# Patient Record
Sex: Female | Born: 1990 | Race: White | Hispanic: No | Marital: Married | Smoking: Never smoker
Health system: Southern US, Community
[De-identification: ages and names within clinical notes are randomized; demographics above are authoritative.]

## PROBLEM LIST (undated history)

## (undated) DIAGNOSIS — G43909 Migraine, unspecified, not intractable, without status migrainosus: Secondary | ICD-10-CM

## (undated) DIAGNOSIS — D649 Anemia, unspecified: Secondary | ICD-10-CM

## (undated) DIAGNOSIS — F32A Depression, unspecified: Secondary | ICD-10-CM

## (undated) DIAGNOSIS — F419 Anxiety disorder, unspecified: Secondary | ICD-10-CM

## (undated) DIAGNOSIS — T7840XA Allergy, unspecified, initial encounter: Secondary | ICD-10-CM

## (undated) DIAGNOSIS — K8020 Calculus of gallbladder without cholecystitis without obstruction: Secondary | ICD-10-CM

## (undated) HISTORY — DX: Depression, unspecified: F32.A

## (undated) HISTORY — DX: Anxiety disorder, unspecified: F41.9

## (undated) HISTORY — DX: Migraine, unspecified, not intractable, without status migrainosus: G43.909

## (undated) HISTORY — PX: NO PAST SURGERIES: SHX2092

## (undated) HISTORY — PX: TUBAL LIGATION: SHX77

## (undated) HISTORY — DX: Allergy, unspecified, initial encounter: T78.40XA

## (undated) HISTORY — DX: Anemia, unspecified: D64.9

---

## 2010-01-09 ENCOUNTER — Inpatient Hospital Stay (HOSPITAL_COMMUNITY)
Admission: AD | Admit: 2010-01-09 | Discharge: 2010-01-11 | Payer: Medicaid Other | Attending: Obstetrics & Gynecology | Admitting: Obstetrics & Gynecology

## 2010-05-15 LAB — CBC
Hemoglobin: 11.2 g/dL — ABNORMAL LOW (ref 12.0–15.0)
MCH: 33.2 pg (ref 26.0–34.0)
MCV: 94.5 fL (ref 78.0–100.0)
Platelets: 179 10*3/uL (ref 150–400)
Platelets: 225 10*3/uL (ref 150–400)
RBC: 3.37 MIL/uL — ABNORMAL LOW (ref 3.87–5.11)
RBC: 3.6 MIL/uL — ABNORMAL LOW (ref 3.87–5.11)
RDW: 12.3 % (ref 11.5–15.5)
WBC: 14.5 10*3/uL — ABNORMAL HIGH (ref 4.0–10.5)
WBC: 15.4 10*3/uL — ABNORMAL HIGH (ref 4.0–10.5)

## 2014-01-10 LAB — OB RESULTS CONSOLE ANTIBODY SCREEN: Antibody Screen: NEGATIVE

## 2014-01-10 LAB — OB RESULTS CONSOLE ABO/RH: RH Type: POSITIVE

## 2014-01-10 LAB — OB RESULTS CONSOLE RUBELLA ANTIBODY, IGM: Rubella: IMMUNE

## 2014-01-10 LAB — OB RESULTS CONSOLE RPR: RPR: NONREACTIVE

## 2014-01-10 LAB — OB RESULTS CONSOLE HEPATITIS B SURFACE ANTIGEN: HEP B S AG: NEGATIVE

## 2014-01-10 LAB — OB RESULTS CONSOLE HIV ANTIBODY (ROUTINE TESTING): HIV: NONREACTIVE

## 2014-03-04 NOTE — L&D Delivery Note (Signed)
Delivery Note At 1:12 PM a viable and healthy female was delivered via Vaginal, Spontaneous Delivery (Presentation: Left Occiput Anterior).  APGAR: 9, 9; weight  .   Placenta status: Intact, Spontaneous.  Cord: 3 vessels with the following complications: None.  Anesthesia: None   Episiotomy:   Lacerations: 1st degree;Perineal Suture Repair: 2.0 chromic Est. Blood Loss (mL): 400  Mom to postpartum.  Baby to Couplet care / Skin to Skin.  Essie HartINN, Dinita Migliaccio STACIA 07/07/2014, 1:51 PM

## 2014-06-09 LAB — OB RESULTS CONSOLE GBS: STREP GROUP B AG: NEGATIVE

## 2014-07-05 ENCOUNTER — Inpatient Hospital Stay (HOSPITAL_COMMUNITY): Payer: Medicaid Other

## 2014-07-05 ENCOUNTER — Encounter (HOSPITAL_COMMUNITY): Payer: Self-pay

## 2014-07-05 ENCOUNTER — Inpatient Hospital Stay (HOSPITAL_COMMUNITY)
Admission: AD | Admit: 2014-07-05 | Discharge: 2014-07-05 | Disposition: A | Discharge status: Home / Self Care | Payer: Medicaid Other | Source: Ambulatory Visit | Attending: Obstetrics & Gynecology | Admitting: Obstetrics & Gynecology

## 2014-07-05 DIAGNOSIS — Z3A39 39 weeks gestation of pregnancy: Secondary | ICD-10-CM | POA: Insufficient documentation

## 2014-07-05 DIAGNOSIS — K8020 Calculus of gallbladder without cholecystitis without obstruction: Secondary | ICD-10-CM

## 2014-07-05 DIAGNOSIS — O9989 Other specified diseases and conditions complicating pregnancy, childbirth and the puerperium: Secondary | ICD-10-CM | POA: Insufficient documentation

## 2014-07-05 DIAGNOSIS — R1010 Upper abdominal pain, unspecified: Secondary | ICD-10-CM | POA: Insufficient documentation

## 2014-07-05 LAB — CBC
HCT: 33.8 % — ABNORMAL LOW (ref 36.0–46.0)
Hemoglobin: 11.7 g/dL — ABNORMAL LOW (ref 12.0–15.0)
MCH: 31.9 pg (ref 26.0–34.0)
MCHC: 34.6 g/dL (ref 30.0–36.0)
MCV: 92.1 fL (ref 78.0–100.0)
PLATELETS: 249 10*3/uL (ref 150–400)
RBC: 3.67 MIL/uL — AB (ref 3.87–5.11)
RDW: 12.9 % (ref 11.5–15.5)
WBC: 16.7 10*3/uL — AB (ref 4.0–10.5)

## 2014-07-05 LAB — COMPREHENSIVE METABOLIC PANEL
ALT: 15 U/L (ref 14–54)
AST: 37 U/L (ref 15–41)
Albumin: 3.3 g/dL — ABNORMAL LOW (ref 3.5–5.0)
Alkaline Phosphatase: 154 U/L — ABNORMAL HIGH (ref 38–126)
Anion gap: 10 (ref 5–15)
BILIRUBIN TOTAL: 0.5 mg/dL (ref 0.3–1.2)
BUN: 8 mg/dL (ref 6–20)
CALCIUM: 8.7 mg/dL — AB (ref 8.9–10.3)
CO2: 22 mmol/L (ref 22–32)
CREATININE: 0.61 mg/dL (ref 0.44–1.00)
Chloride: 104 mmol/L (ref 101–111)
GFR calc Af Amer: >60 mL/min (ref >60)
GLUCOSE: 105 mg/dL — AB (ref 70–99)
Potassium: 3.5 mmol/L (ref 3.5–5.1)
Sodium: 136 mmol/L (ref 135–145)
TOTAL PROTEIN: 7.3 g/dL (ref 6.5–8.1)

## 2014-07-05 LAB — AMYLASE: AMYLASE: 35 U/L (ref 28–100)

## 2014-07-05 LAB — AMNISURE RUPTURE OF MEMBRANE (ROM) NOT AT ARMC: Amnisure ROM: NEGATIVE

## 2014-07-05 LAB — LIPASE, BLOOD: LIPASE: 22 U/L (ref 22–51)

## 2014-07-05 MED ORDER — ONDANSETRON 8 MG PO TBDP
8.0000 mg | ORAL_TABLET | Freq: Once | ORAL | Status: AC
  Administered 2014-07-05: 8 mg via ORAL
  Filled 2014-07-05: qty 1

## 2014-07-05 MED ORDER — GI COCKTAIL ~~LOC~~
30.0000 mL | Freq: Once | ORAL | Status: AC
  Administered 2014-07-05: 30 mL via ORAL
  Filled 2014-07-05: qty 30

## 2014-07-05 MED ORDER — OXYCODONE-ACETAMINOPHEN 5-325 MG PO TABS: 1.0000 | ORAL_TABLET | ORAL | Status: DC | PRN

## 2014-07-05 MED ORDER — OXYCODONE-ACETAMINOPHEN 5-325 MG PO TABS
2.0000 | ORAL_TABLET | Freq: Once | ORAL | Status: AC
  Administered 2014-07-05: 2 via ORAL
  Filled 2014-07-05: qty 2

## 2014-07-05 NOTE — MAU Note (Signed)
Pt is here with c/o sharp upper abdominal pain, as well as sharp pains in upper back.  Denies any bleeding, but reports possible leaking of fluid. Reports positive fetal movement.

## 2014-07-05 NOTE — MAU Note (Signed)
Amnisure collected and sent to lab.

## 2014-07-05 NOTE — MAU Provider Note (Signed)
History     CSN: [id number]  Arrival date and time: 07/05/14 0006   First Provider Initiated Contact with Patient 07/05/14 0149      Chief Complaint  Patient presents with  . Abdominal Pain   HPI Comments: Sandra Brewer is a 24 y.o a G2P1001 at [redacted]w[redacted]d who presents today with upper abdominal pain. She states the pain started at 2230, and around 2000 she had eaten tuna salad, and prior to that around 1700 she had hamburger helper. She denies any sick contacts or VB. She has had some contractions, and had "leaking" yesterday. She sates that the fetus has been moving normally. She denies any sick contacts.   Abdominal Pain This is a new problem. The current episode started today (at 2230 on 07/04/14). The onset quality is sudden. The problem occurs constantly. The problem has been unchanged. The pain is located in the epigastric region. The pain is at a severity of 9/10. The quality of the pain is sharp. The abdominal pain radiates to the RUQ. Associated symptoms include nausea and vomiting (x2 since pain started ). Pertinent negatives include no constipation, diarrhea (loose stool this am ), dysuria, fever or frequency. The pain is aggravated by certain positions. The pain is relieved by nothing. She has tried nothing for the symptoms.    History reviewed. No pertinent past medical history.  History reviewed. No pertinent past surgical history.  No family history on file.  History  Substance Use Topics  . Smoking status: Not on file  . Smokeless tobacco: Not on file  . Alcohol Use: Not on file    Allergies: No Known Allergies  Prescriptions prior to admission  Medication Sig Dispense Refill Last Dose  . omeprazole (PRILOSEC) 20 MG capsule Take 20 mg by mouth daily.   07/04/2014 at Unknown time  . Prenatal Vit-Fe Fumarate-FA (MULTIVITAMIN-PRENATAL) 27-0.8 MG TABS tablet Take 1 tablet by mouth daily at 12 noon.   07/05/2014 at Unknown time    Review of Systems  Constitutional: Negative for  fever.  Gastrointestinal: Positive for nausea, vomiting (x2 since pain started ) and abdominal pain. Negative for diarrhea (loose stool this am ) and constipation.  Genitourinary: Negative for dysuria, urgency and frequency.   Physical Exam   Blood pressure 127/83, pulse 96, temperature 98.1 F (36.7 C), temperature source Oral, resp. rate 20.  Physical Exam  Nursing note and vitals reviewed. Constitutional: She is oriented to person, place, and time. She appears well-developed and well-nourished. No distress.  Cardiovascular: Normal rate.   Respiratory: Effort normal.  GI: Soft. There is no tenderness. There is no rebound.  Neurological: She is alert and oriented to person, place, and time.  Skin: Skin is warm and dry.  Psychiatric: She has a normal mood and affect.   FHT 130, moderate with 15x15 accels, no decels Toco: irregular   Results for orders placed or performed during the hospital encounter of 07/05/14 (from the past 24 hour(s))  Amnisure rupture of membrane (rom)     Status: None   Collection Time: 07/05/14  1:22 AM  Result Value Ref Range   Amnisure ROM NEGATIVE   CBC     Status: Abnormal   Collection Time: 07/05/14  2:05 AM  Result Value Ref Range   WBC 16.7 (H) 4.0 - 10.5 K/uL   RBC 3.67 (L) 3.87 - 5.11 MIL/uL   Hemoglobin 11.7 (L) 12.0 - 15.0 g/dL   HCT 57.8 (L) 46.9 - 62.9 %   MCV 92.1 78.0 -  100.0 fL   MCH 31.9 26.0 - 34.0 pg   MCHC 34.6 30.0 - 36.0 g/dL   RDW 40.912.9 81.111.5 - 91.415.5 %   Platelets 249 150 - 400 K/uL  Comprehensive metabolic panel     Status: Abnormal   Collection Time: 07/05/14  2:05 AM  Result Value Ref Range   Sodium 136 135 - 145 mmol/L   Potassium 3.5 3.5 - 5.1 mmol/L   Chloride 104 101 - 111 mmol/L   CO2 22 22 - 32 mmol/L   Glucose, Bld 105 (H) 70 - 99 mg/dL   BUN 8 6 - 20 mg/dL   Creatinine, Ser 7.820.61 0.44 - 1.00 mg/dL   Calcium 8.7 (L) 8.9 - 10.3 mg/dL   Total Protein 7.3 6.5 - 8.1 g/dL   Albumin 3.3 (L) 3.5 - 5.0 g/dL   AST 37 15 -  41 U/L   ALT 15 14 - 54 U/L   Alkaline Phosphatase 154 (H) 38 - 126 U/L   Total Bilirubin 0.5 0.3 - 1.2 mg/dL   GFR calc non Af Amer >60 >60 mL/min   GFR calc Af Amer >60 >60 mL/min   Anion gap 10 5 - 15   [place] Abdomen Complete  07/05/2014   CLINICAL DATA:  Upper abdominal and back pain. Thirty-nine weeks pregnant. Leukocytosis.  EXAM: ULTRASOUND ABDOMEN COMPLETE  COMPARISON:  None.  FINDINGS: Gallbladder: There are numerous calculi within the gallbladder lumen. There is no gallbladder wall thickening or pericholecystic fluid. There is no tenderness over the gallbladder.  Common bile duct: Diameter: 3.8 mm, normal  Liver: No focal lesion identified. Within normal limits in parenchymal echogenicity.  IVC: No abnormality visualized.  Pancreas: Visualized portion unremarkable.  Spleen: Size and appearance within normal limits.  Right Kidney: Length: 10.5 cm. Echogenicity within normal limits. No mass or hydronephrosis visualized.  Left Kidney: Length: 10.6 cm. Echogenicity within normal limits. No mass or hydronephrosis visualized.  Abdominal aorta: No aneurysm visualized.  Other findings: None.  IMPRESSION: Cholelithiasis without sonographic evidence of cholecystitis.   Electronically Signed   By: Ellery Plunkaniel R Mitchell M.D.   On: 07/05/2014 02:48    MAU Course  Procedures  MDM 0050: Patient had been given a GI cocktail, but vomited right after taking 0302: No cervical change  0305: D/W Dr. Mora ApplPinn. OK for DC home. May have percocet here before DC and then RX for home. FU with the office as planned   Assessment and Plan   1. Calculus of gallbladder without cholecystitis without obstruction   2. [redacted] weeks gestation of pregnancy    DC home Rx percocet 5/325 #20, 0RF Return to MAU as needed Low fat diet information given   Follow-up Information    [facility] Sanjuana MaeWALDA STACIA, MD.   Specialty:  Obstetrics and Gynecology   Why:  As scheduled   Contact information:   [address] [place] [address] [phone]        Tawnya CrookHogan, Samier Jaco Donovan 07/05/2014, 1:50 AM

## 2014-07-05 NOTE — Discharge Instructions (Signed)
Low-Fat Diet for Pancreatitis or Gallbladder Conditions °A low-fat diet can be helpful if you have pancreatitis or a gallbladder condition. With these conditions, your pancreas and gallbladder have trouble digesting fats. A healthy eating plan with less fat will help rest your pancreas and gallbladder and reduce your symptoms. °WHAT DO I NEED TO KNOW ABOUT THIS DIET? °· Eat a low-fat diet. °¨ Reduce your fat intake to less than 20-30% of your total daily calories. This is less than 50-60 g of fat per day. °¨ Remember that you need some fat in your diet. Ask your dietician what your daily goal should be. °¨ Choose nonfat and low-fat healthy foods. Look for the words "nonfat," "low fat," or "fat free." °¨ As a guide, look on the label and choose foods with less than 3 g of fat per serving. Eat only one serving. °· Avoid alcohol. °· Do not smoke. If you need help quitting, talk with your health care provider. °· Eat small frequent meals instead of three large heavy meals. °WHAT FOODS CAN I EAT? °Grains °Include healthy grains and starches such as potatoes, wheat bread, fiber-rich cereal, and brown rice. Choose whole grain options whenever possible. In adults, whole grains should account for 45-65% of your daily calories.  °Fruits and Vegetables °Eat plenty of fruits and vegetables. Fresh fruits and vegetables add fiber to your diet. °Meats and Other Protein Sources °Eat lean meat such as chicken and pork. Trim any fat off of meat before cooking it. Eggs, fish, and beans are other sources of protein. In adults, these foods should account for 10-35% of your daily calories. °Dairy °Choose low-fat milk and dairy options. Dairy includes fat and protein, as well as calcium.  °Fats and Oils °Limit high-fat foods such as fried foods, sweets, baked goods, sugary drinks.  °Other °Creamy sauces and condiments, such as mayonnaise, can add extra fat. Think about whether or not you need to use them, or use smaller amounts or low fat  options. °WHAT FOODS ARE NOT RECOMMENDED? °· High fat foods, such as: °¨ Baked goods. °¨ Ice cream. °¨ French toast. °¨ Sweet rolls. °¨ Pizza. °¨ Cheese bread. °¨ Foods covered with batter, butter, creamy sauces, or cheese. °¨ Fried foods. °¨ Sugary drinks and desserts. °· Foods that cause gas or bloating °Document Released: 02/23/2013 Document Reviewed: 02/23/2013 °ExitCare® Patient Information ©2015 [employer]. This information is not intended to replace advice given to you by your health care provider. Make sure you discuss any questions you have with your health care provider. ° °

## 2014-07-07 ENCOUNTER — Inpatient Hospital Stay (HOSPITAL_COMMUNITY)
Admission: AD | Admit: 2014-07-07 | Discharge: 2014-07-09 | DRG: 775 | Disposition: A | Discharge status: Home / Self Care | Payer: Medicaid Other | Source: Ambulatory Visit | Attending: Obstetrics & Gynecology | Admitting: Obstetrics & Gynecology

## 2014-07-07 ENCOUNTER — Encounter (HOSPITAL_COMMUNITY): Payer: Self-pay

## 2014-07-07 DIAGNOSIS — Z3A39 39 weeks gestation of pregnancy: Secondary | ICD-10-CM | POA: Diagnosis present

## 2014-07-07 DIAGNOSIS — IMO0001 Reserved for inherently not codable concepts without codable children: Secondary | ICD-10-CM

## 2014-07-07 DIAGNOSIS — Z3483 Encounter for supervision of other normal pregnancy, third trimester: Secondary | ICD-10-CM | POA: Diagnosis present

## 2014-07-07 HISTORY — DX: Calculus of gallbladder without cholecystitis without obstruction: K80.20

## 2014-07-07 LAB — CBC
HEMATOCRIT: 35.5 % — AB (ref 36.0–46.0)
HEMOGLOBIN: 12.5 g/dL (ref 12.0–15.0)
MCH: 32.4 pg (ref 26.0–34.0)
MCHC: 35.2 g/dL (ref 30.0–36.0)
MCV: 92 fL (ref 78.0–100.0)
Platelets: 280 10*3/uL (ref 150–400)
RBC: 3.86 MIL/uL — ABNORMAL LOW (ref 3.87–5.11)
RDW: 12.8 % (ref 11.5–15.5)
WBC: 14 10*3/uL — ABNORMAL HIGH (ref 4.0–10.5)

## 2014-07-07 LAB — TYPE AND SCREEN
ABO/RH(D): A POS
Antibody Screen: NEGATIVE

## 2014-07-07 LAB — ABO/RH: ABO/RH(D): A POS

## 2014-07-07 MED ORDER — OXYCODONE-ACETAMINOPHEN 5-325 MG PO TABS: 1.0000 | ORAL_TABLET | ORAL | Status: DC | PRN

## 2014-07-07 MED ORDER — OXYCODONE-ACETAMINOPHEN 5-325 MG PO TABS: 2.0000 | ORAL_TABLET | ORAL | Status: DC | PRN

## 2014-07-07 MED ORDER — OXYTOCIN BOLUS FROM INFUSION
500.0000 mL | INTRAVENOUS | Status: DC
  Administered 2014-07-07: 500 mL via INTRAVENOUS

## 2014-07-07 MED ORDER — ACETAMINOPHEN 325 MG PO TABS
650.0000 mg | ORAL_TABLET | ORAL | Status: DC | PRN
Start: 2014-07-07 — End: 2014-07-09

## 2014-07-07 MED ORDER — ONDANSETRON HCL 4 MG/2ML IJ SOLN: 4.0000 mg | INTRAMUSCULAR | Status: DC | PRN

## 2014-07-07 MED ORDER — LACTATED RINGERS IV SOLN: INTRAVENOUS | Status: DC

## 2014-07-07 MED ORDER — BUTORPHANOL TARTRATE 1 MG/ML IJ SOLN
1.0000 mg | INTRAMUSCULAR | Status: DC | PRN
  Administered 2014-07-07: 1 mg via INTRAVENOUS
  Filled 2014-07-07: qty 1

## 2014-07-07 MED ORDER — PRENATAL MULTIVITAMIN CH
1.0000 | ORAL_TABLET | Freq: Every day | ORAL | Status: DC
  Administered 2014-07-08: 1 via ORAL
  Filled 2014-07-07 (×3): qty 1

## 2014-07-07 MED ORDER — CITRIC ACID-SODIUM CITRATE 334-500 MG/5ML PO SOLN: 30.0000 mL | ORAL | Status: DC | PRN

## 2014-07-07 MED ORDER — LIDOCAINE HCL (PF) 1 % IJ SOLN
30.0000 mL | INTRAMUSCULAR | Status: AC | PRN
  Administered 2014-07-07: 30 mL via SUBCUTANEOUS
  Filled 2014-07-07: qty 30

## 2014-07-07 MED ORDER — ONDANSETRON HCL 4 MG PO TABS
4.0000 mg | ORAL_TABLET | ORAL | Status: DC | PRN
Start: 2014-07-07 — End: 2014-07-09

## 2014-07-07 MED ORDER — OXYTOCIN 40 UNITS IN LACTATED RINGERS INFUSION - SIMPLE MED
62.5000 mL/h | INTRAVENOUS | Status: DC
  Administered 2014-07-07: 62.5 mL/h via INTRAVENOUS

## 2014-07-07 MED ORDER — BENZOCAINE-MENTHOL 20-0.5 % EX AERO
1.0000 "application " | INHALATION_SPRAY | CUTANEOUS | Status: DC | PRN
  Filled 2014-07-07: qty 56

## 2014-07-07 MED ORDER — METHYLERGONOVINE MALEATE 0.2 MG/ML IJ SOLN: 0.2000 mg | INTRAMUSCULAR | Status: DC | PRN

## 2014-07-07 MED ORDER — LACTATED RINGERS IV SOLN: 500.0000 mL | INTRAVENOUS | Status: DC | PRN

## 2014-07-07 MED ORDER — DIBUCAINE 1 % RE OINT
1.0000 "application " | TOPICAL_OINTMENT | RECTAL | Status: DC | PRN
  Filled 2014-07-07: qty 28

## 2014-07-07 MED ORDER — FLEET ENEMA 7-19 GM/118ML RE ENEM: 1.0000 | ENEMA | RECTAL | Status: DC | PRN

## 2014-07-07 MED ORDER — METHYLERGONOVINE MALEATE 0.2 MG PO TABS: 0.2000 mg | ORAL_TABLET | ORAL | Status: DC | PRN

## 2014-07-07 MED ORDER — OXYTOCIN 40 UNITS IN LACTATED RINGERS INFUSION - SIMPLE MED
INTRAVENOUS | Status: AC
  Administered 2014-07-07: 62.5 mL/h via INTRAVENOUS
  Filled 2014-07-07: qty 1000

## 2014-07-07 MED ORDER — ZOLPIDEM TARTRATE 5 MG PO TABS: 5.0000 mg | ORAL_TABLET | Freq: Every evening | ORAL | Status: DC | PRN

## 2014-07-07 MED ORDER — WITCH HAZEL-GLYCERIN EX PADS: 1.0000 "application " | MEDICATED_PAD | CUTANEOUS | Status: DC | PRN

## 2014-07-07 MED ORDER — SIMETHICONE 80 MG PO CHEW
80.0000 mg | CHEWABLE_TABLET | ORAL | Status: DC | PRN
  Filled 2014-07-07 (×2): qty 1

## 2014-07-07 MED ORDER — IBUPROFEN 600 MG PO TABS
600.0000 mg | ORAL_TABLET | Freq: Four times a day (QID) | ORAL | Status: DC
  Administered 2014-07-07 – 2014-07-09 (×7): 600 mg via ORAL
  Filled 2014-07-07 (×7): qty 1

## 2014-07-07 MED ORDER — OXYTOCIN 40 UNITS IN LACTATED RINGERS INFUSION - SIMPLE MED: 62.5000 mL/h | INTRAVENOUS | Status: DC | PRN

## 2014-07-07 MED ORDER — TETANUS-DIPHTH-ACELL PERTUSSIS 5-2.5-18.5 LF-MCG/0.5 IM SUSP
0.5000 mL | Freq: Once | INTRAMUSCULAR | Status: DC
  Filled 2014-07-07: qty 0.5

## 2014-07-07 MED ORDER — LANOLIN HYDROUS EX OINT: TOPICAL_OINTMENT | CUTANEOUS | Status: DC | PRN

## 2014-07-07 MED ORDER — LIDOCAINE HCL (PF) 1 % IJ SOLN
INTRAMUSCULAR | Status: AC
Start: 2014-07-07 — End: 2014-07-09
  Administered 2014-07-07: 30 mL via SUBCUTANEOUS
  Filled 2014-07-07: qty 30

## 2014-07-07 MED ORDER — ONDANSETRON HCL 4 MG/2ML IJ SOLN: 4.0000 mg | Freq: Four times a day (QID) | INTRAMUSCULAR | Status: DC | PRN

## 2014-07-07 MED ORDER — SENNOSIDES-DOCUSATE SODIUM 8.6-50 MG PO TABS
2.0000 | ORAL_TABLET | ORAL | Status: DC
  Administered 2014-07-07 – 2014-07-08 (×2): 2 via ORAL
  Filled 2014-07-07 (×3): qty 2

## 2014-07-07 MED ORDER — DIPHENHYDRAMINE HCL 25 MG PO CAPS: 25.0000 mg | ORAL_CAPSULE | Freq: Four times a day (QID) | ORAL | Status: DC | PRN

## 2014-07-07 MED ORDER — ACETAMINOPHEN 325 MG PO TABS: 650.0000 mg | ORAL_TABLET | ORAL | Status: DC | PRN

## 2014-07-07 NOTE — Progress Notes (Signed)
Dr. Mora ApplPinn at bedside.  Patient pushing

## 2014-07-07 NOTE — H&P (Signed)
Sandra JacobsonDonna Rhudy is a 24 y.o. female G2P1 presenting with painful contractions s/p SROM. + bl;oody show,  Notes normal fetal movement.  Maternal Medical History:  Reason for admission: Rupture of membranes and contractions.   Contractions: Onset was 3-5 hours ago.   Frequency: regular.   Duration is approximately 60 seconds.   Perceived severity is strong.    Fetal activity: Perceived fetal activity is normal.   Last perceived fetal movement was within the past 12 hours.    Prenatal complications: no prenatal complications Prenatal Complications - Diabetes: none.    OB History    Gravida Para Term Preterm AB TAB SAB Ectopic Multiple Living   2 2 2       0 2     Past Medical History  Diagnosis Date  . Gall stones    History reviewed. No pertinent past surgical history. Family History: family history is not on file. Social History:  reports that she has never smoked. She does not have any smokeless tobacco history on file. She reports that she does not drink alcohol or use illicit drugs.   Prenatal Transfer Tool  Maternal Diabetes: No Genetic Screening: Normal Maternal Ultrasounds/Referrals: Normal Fetal Ultrasounds or other Referrals:  Other: Anatomy ultrasound normal Maternal Substance Abuse:  No Significant Maternal Medications:  None Significant Maternal Lab Results:  Lab values include: Group B Strep negative Other Comments:  None  Review of Systems  All other systems reviewed and are negative.   Dilation: 10 Effacement (%): 100 Station: -1 Exam by:: cheryl Anderson RN Blood pressure 135/73, pulse 93, temperature 98.4 F (36.9 C), temperature source Oral, resp. rate 18, unknown if currently breastfeeding. Maternal Exam:  Uterine Assessment: Contraction strength is moderate.  Contraction duration is 60 seconds. Contraction frequency is regular.   Abdomen: Patient reports no abdominal tenderness. Fundal height is 39 cm.   Estimated fetal weight is 3300 grams.    Fetal presentation: vertex  Introitus: Normal vulva. Normal vagina.  Ferning test: positive.  Amniotic fluid character: clear.  Pelvis: adequate for delivery.   Cervix: Cervix evaluated by digital exam.   On admission to MAU 6/90/-1  Fetal Exam Fetal Monitor Review: Baseline rate: 120.  Variability: moderate (6-25 bpm).   Pattern: no decelerations and accelerations present.    Fetal State Assessment: Category I - tracings are normal.     Physical Exam  Vitals reviewed. Constitutional: She is oriented to person, place, and time. She appears well-developed and well-nourished.  HENT:  Head: Normocephalic.  Cardiovascular: Normal rate.   Respiratory: Effort normal.  Musculoskeletal: Normal range of motion.  Neurological: She is alert and oriented to person, place, and time.    Prenatal labs: ABO, Rh: A/Positive/-- (11/09 0000) Antibody: Negative (11/09 0000) Rubella: Immune (11/09 0000) RPR: Nonreactive (11/09 0000)  HBsAg: Negative (11/09 0000)  HIV: Non-reactive (11/09 0000)  GBS: Negative (04/07 0000)   Assessment/Plan: 24 yo G2P1 at 39 weeks 3 days SROM in active labor No rooms on L&D patient counseled she will most likely deliver in MAU IV stadol for pain    Gardiner Espana STACIA 07/07/2014, 1:45 PM

## 2014-07-07 NOTE — MAU Note (Signed)
Notified Dr. Mora ApplPinn, patient G2P1 2348w3d grossly ruptured clear fluid, 6/100/-1, contractions every 2 to 4,moderate, patient desires epidural however no beds available in L&D, MD to contact L&D and call me back.

## 2014-07-08 LAB — CBC
HCT: 33 % — ABNORMAL LOW (ref 36.0–46.0)
Hemoglobin: 11.5 g/dL — ABNORMAL LOW (ref 12.0–15.0)
MCH: 32.2 pg (ref 26.0–34.0)
MCHC: 34.8 g/dL (ref 30.0–36.0)
MCV: 92.4 fL (ref 78.0–100.0)
Platelets: 233 10*3/uL (ref 150–400)
RBC: 3.57 MIL/uL — ABNORMAL LOW (ref 3.87–5.11)
RDW: 12.8 % (ref 11.5–15.5)
WBC: 17.4 10*3/uL — ABNORMAL HIGH (ref 4.0–10.5)

## 2014-07-08 LAB — RPR: RPR Ser Ql: NONREACTIVE

## 2014-07-08 NOTE — Progress Notes (Signed)
UR chart review completed.  

## 2014-07-08 NOTE — Progress Notes (Signed)
PPD#1 Pt without c/o. Baby is doing well VSSAF IMP/Stable Plan/ Routine care.

## 2014-07-09 NOTE — Discharge Summary (Signed)
Obstetric Discharge Summary Reason for Admission: onset of labor and rupture of membranes Prenatal Procedures: ultrasound Intrapartum Procedures: spontaneous vaginal delivery Postpartum Procedures: none Complications-Operative and Postpartum: 1st degree perineal laceration HEMOGLOBIN  Date Value Ref Range Status  07/08/2014 11.5* 12.0 - 15.0 g/dL Final   HCT  Date Value Ref Range Status  07/08/2014 33.0* 36.0 - 46.0 % Final    Physical Exam:  General: alert Lochia: appropriate Uterine Fundus: firm   Discharge Diagnoses: Term Pregnancy-delivered  Discharge Information: Date: 07/09/2014 Activity: pelvic rest Diet: routine Medications: PNV and Ibuprofen Condition: stable Instructions: refer to practice specific booklet Discharge to: home Follow-up Information    [facility] Sanjuana MaeWALDA STACIA, MD. Schedule an appointment as soon as possible for a visit in 1 month.   Specialty:  Obstetrics and Gynecology   Contact information:   [address] [place] [address] [phone]       Newborn Data: Live born female  Birth Weight: 6 lb 13.5 oz (3105 g) APGAR: 9, 9  Home with mother.  Erisa Mehlman E 07/09/2014, 9:23 AM

## 2014-07-09 NOTE — Progress Notes (Signed)
PPD#2 Pt doing well. Ready for discharge. VSSAF IMP/ Stable Plan/ Will discharge.

## 2015-08-22 ENCOUNTER — Encounter: Payer: Self-pay | Admitting: Obstetrics and Gynecology

## 2015-08-22 ENCOUNTER — Ambulatory Visit (INDEPENDENT_AMBULATORY_CARE_PROVIDER_SITE_OTHER): Payer: Medicaid Other | Admitting: Obstetrics and Gynecology

## 2015-08-22 ENCOUNTER — Other Ambulatory Visit (HOSPITAL_COMMUNITY)
Admission: RE | Admit: 2015-08-22 | Discharge: 2015-08-22 | Disposition: A | Discharge status: Home / Self Care | Payer: Medicaid Other | Source: Ambulatory Visit | Attending: Obstetrics and Gynecology | Admitting: Obstetrics and Gynecology

## 2015-08-22 VITALS — BP 120/80 | Ht 59.0 in | Wt 152.0 lb

## 2015-08-22 DIAGNOSIS — Z01419 Encounter for gynecological examination (general) (routine) without abnormal findings: Secondary | ICD-10-CM | POA: Insufficient documentation

## 2015-08-22 DIAGNOSIS — Z3009 Encounter for other general counseling and advice on contraception: Secondary | ICD-10-CM

## 2015-08-22 DIAGNOSIS — Z309 Encounter for contraceptive management, unspecified: Secondary | ICD-10-CM | POA: Diagnosis not present

## 2015-08-22 DIAGNOSIS — Z3041 Encounter for surveillance of contraceptive pills: Secondary | ICD-10-CM

## 2015-08-22 DIAGNOSIS — Z113 Encounter for screening for infections with a predominantly sexual mode of transmission: Secondary | ICD-10-CM | POA: Insufficient documentation

## 2015-08-22 MED ORDER — VIORELE 0.15-0.02/0.01 MG (21/5) PO TABS: 1.0000 | ORAL_TABLET | Freq: Every day | ORAL | Status: DC

## 2015-08-22 NOTE — Progress Notes (Signed)
Patient ID: Sandra JacobsonDonna Menard, female   DOB: 12/12/1990, 25 y.o.   MRN: [id number]  Chief Complaint  Patient presents with  . Pap and Physical    annual exam   Medicaid family planning visit  HPI Sandra JacobsonDonna Huckeby is a 25 y.o. female.  She is here for Medicaid family planning visit and desires to sign tubal sterilization forms. She is a gravida 2 para 2, married with children ages 146 and 1, and she and her partner desire her to pursue permanent sterilization. We've reviewed the procedure emphasizing its permanency with information about the technical aspects of the procedure. We talked about different types tying the tubes versus removing the tissues by salpingectomy. The pros and cons of salpingectomy with a cancer risk reduction have been discussed with the patient. she desires to proceed with tubal sterilization when the 30 day Medicaid waiting. Is completed. HPI  Past Medical History  Diagnosis Date  . Gall stones     History reviewed. No pertinent past surgical history.  Family History  Problem Relation Age of Onset  . Hypertension Mother   . Hypertension Father     Social History Social History  Substance Use Topics  . Smoking status: Never Smoker   . Smokeless tobacco: Never Used  . Alcohol Use: No    No Known Allergies  Current Outpatient Prescriptions  Medication Sig Dispense Refill  . VIORELE 0.15-0.02/0.01 MG (21/5) tablet Take 1 tablet by mouth daily. 1 Package 0   No current facility-administered medications for this visit.    Review of SystemsNegative Review of Systems  Blood pressure 120/80, height 4\' 11"  (1.499 m), weight 152 lb (68.947 kg), last menstrual period 08/10/2015, not currently breastfeeding.  Physical Exam Physical Exam  Constitutional: She is oriented to person, place, and time. She appears well-developed and well-nourished.  HENT:  Head: Normocephalic and atraumatic.  Eyes: Pupils are equal, round, and reactive to light.  Neck: Normal range of  motion.  Pulmonary/Chest: Effort normal.  Abdominal: Soft.  Genitourinary: Vagina normal and uterus normal. No vaginal discharge found.  Musculoskeletal: Normal range of motion.  Neurological: She is alert and oriented to person, place, and time.  Skin: Skin is warm and dry.  Psychiatric: She has a normal mood and affect. Her behavior is normal. Judgment and thought content normal.    Data Reviewed blood type A+ hemoglobin 11 during last pregnancy  Assessment    Desire desire for elective permanent sterilization    Plan    Pap smear obtained. Collected GC chlamydia HIV and RPR Tubal sterilization forms completed  follow-up visit 4 weeks for preop       Aniruddh Ciavarella V 08/22/2015, 4:35 PM

## 2015-08-22 NOTE — Patient Instructions (Signed)
Diagnostic Laparoscopy A diagnostic laparoscopy is a procedure to diagnose diseases in the abdomen. During the procedure, a thin, lighted, pencil-sized instrument called a laparoscope is inserted into the abdomen through an incision. The laparoscope allows your health care provider to look at the organs inside your body. LET [facility] CARE PROVIDER KNOW ABOUT:  Any allergies you have.  All medicines you are taking, including vitamins, herbs, eye drops, creams, and over-the-counter medicines.  Previous problems you or members of your family have had with the use of anesthetics.  Any blood disorders you have.  Previous surgeries you have had.  Medical conditions you have. RISKS AND COMPLICATIONS  Generally, this is a safe procedure. However, problems can occur, which may include:  Infection.  Bleeding.  Damage to other organs.  Allergic reaction to the anesthetics used during the procedure. BEFORE THE PROCEDURE  Do not eat or drink anything after midnight on the night before the procedure or as directed by your health care provider.  Ask your health care provider about:  Changing or stopping your regular medicines.  Taking medicines such as aspirin and ibuprofen. These medicines can thin your blood. Do not take these medicines before your procedure if your health care provider instructs you not to.  Plan to have someone take you home after the procedure. PROCEDURE  You may be given a medicine to help you relax (sedative).  You will be given a medicine to make you sleep (general anesthetic).  Your abdomen will be inflated with a gas. This will make your organs easier to see.  Small incisions will be made in your abdomen.  A laparoscope and other small instruments will be inserted into the abdomen through the incisions.  A tissue sample may be removed from an organ in the abdomen for examination.  The instruments will be removed from the abdomen.  The gas will be  released.  The incisions will be closed with stitches (sutures). AFTER THE PROCEDURE  Your blood pressure, heart rate, breathing rate, and blood oxygen level will be monitored often until the medicines you were given have worn off.   This information is not intended to replace advice given to you by your health care provider. Make sure you discuss any questions you have with your health care provider.   Document Released: 05/27/2000 Document Revised: 11/09/2014 Document Reviewed: 10/01/2013 Elsevier Interactive Patient Education 2016 Else[employer] Inc.    Laparoscopic Tubal Ligation, Care After Refer to this sheet in the next few weeks. These instructions provide you with information about caring for yourself after your procedure. Your health care provider may also give you more specific instructions. Your treatment has been planned according to current medical practices, but problems sometimes occur. Call your health care provider if you have any problems or questions after your procedure. WHAT TO EXPECT AFTER THE PROCEDURE After your procedure, it is common to have:  Sore throat.  Soreness at the incision site.  Mild cramping.  Tiredness.  Mild nausea or vomiting.  Shoulder pain. HOME CARE INSTRUCTIONS  Rest for the remainder of the day.  Take medicines only as directed by your health care provider. These include over-the-counter medicines and prescription medicines. Do not take aspirin, which can cause bleeding.  Over the next few days, gradually return to your normal activities and your normal diet.  Avoid sexual intercourse for 2 weeks or as directed by your health care provider.  Do not use tampons, and do not douche.  Do not drive or operate  heavy machinery while taking pain medicine.  Do not lift anything that is heavier than 5 lb (2.3 kg) for 2 weeks or as directed by your health care provider.  Do not take baths. Take showers only. Ask your health care provider when  you can start taking baths.  Take your temperature twice each day and write it down.  Try to have help for your household needs for the first 7-10 days.  There are many different ways to close and cover an incision, including stitches (sutures), skin glue, and adhesive strips. Follow instructions from your health care provider about:  Incision care.  Bandage (dressing) changes and removal.  Incision closure removal.  Check your incision area every day for signs of infection. Watch for:  Redness, swelling, or pain.  Fluid, blood, or pus.  Keep all follow-up visits as directed by your health care provider. SEEK MEDICAL CARE IF:  You have redness, swelling, or increasing pain in your incision area.  You have fluid, blood, or pus coming from your incision for longer than 1 day.  You notice a bad smell coming from your incision or your dressing.  The edges of your incision break open after the sutures have been removed.  Your pain does not decrease after 2-3 days.  You have a rash.  You repeatedly become dizzy or light-headed.  You have a reaction to your medicine.  Your pain medicine is not helping.  You are constipated. SEEK IMMEDIATE MEDICAL CARE IF:  You have a fever.  You faint.  You have increasing pain in your abdomen.  You have severe pain in one or both of your shoulders.  You have bleeding or drainage from your suture sites or your vagina after surgery.  You have shortness of breath or have difficulty breathing.  You have chest pain or leg pain.  You have ongoing nausea, vomiting, or diarrhea.   This information is not intended to replace advice given to you by your health care provider. Make sure you discuss any questions you have with your health care provider.   Document Released: 09/07/2004 Document Revised: 07/05/2014 Document Reviewed: 06/01/2011 Elsevier Interactive Patient Education [employer] Else[employer] Inc.

## 2015-08-23 LAB — HIV-1 RNA QUANT-NO REFLEX-BLD: HIV-1 RNA Viral Load: <20 copies/mL

## 2015-08-23 LAB — RPR: RPR: NONREACTIVE

## 2015-08-24 LAB — CYTOLOGY - PAP

## 2015-09-19 ENCOUNTER — Encounter: Payer: Medicaid Other | Admitting: Obstetrics and Gynecology

## 2015-09-20 ENCOUNTER — Encounter: Payer: Self-pay | Admitting: Obstetrics and Gynecology

## 2015-09-20 ENCOUNTER — Other Ambulatory Visit: Payer: Self-pay | Admitting: Obstetrics and Gynecology

## 2015-09-20 ENCOUNTER — Ambulatory Visit (INDEPENDENT_AMBULATORY_CARE_PROVIDER_SITE_OTHER): Payer: Medicaid Other | Admitting: Obstetrics and Gynecology

## 2015-09-20 VITALS — BP 110/70 | Ht 59.0 in | Wt 154.0 lb

## 2015-09-20 DIAGNOSIS — Z302 Encounter for sterilization: Secondary | ICD-10-CM | POA: Diagnosis not present

## 2015-09-20 DIAGNOSIS — Z3009 Encounter for other general counseling and advice on contraception: Secondary | ICD-10-CM | POA: Insufficient documentation

## 2015-09-20 NOTE — Progress Notes (Addendum)
Patient ID: Sandra Brewer, female   DOB: 01/29/1991, 25 y.o.   MRN: [id number]  Preoperative History and Physical  Sandra Brewer is a 25 y.o. [id number] here for surgical management of permanent sterilization. No significant preoperative concerns. LMP 09/07/15. She is currently using oral contraceptives. She denies cough, SOB, rhinorrhea, congestion, fever, chills, abdominal pain, nausea, emesis, diarrhea.   Proposed surgery: BTL bilateral salpingectomy  Past Medical History  Diagnosis Date  . Gall stones    History reviewed. No pertinent past surgical history. OB History  Gravida Para Term Preterm AB SAB TAB Ectopic Multiple Living  2 2 2       0 2    # Outcome Date GA Lbr Len/2nd Weight Sex Delivery Anes PTL Lv  2 Term 07/07/14 2215w3d 03:52 / 00:20 6 lb 13.5 oz (3.105 kg) F Vag-Spont None  Y  1 Term 01/09/10 10975w0d   F Vag-Spont  N Y    Patient denies any other pertinent gynecologic issues.   Current Outpatient Prescriptions on File Prior to Visit  Medication Sig Dispense Refill  . VIORELE 0.15-0.02/0.01 MG (21/5) tablet Take 1 tablet by mouth daily. 1 Package 0   No current facility-administered medications on file prior to visit.   No Known Allergies  Social History:   reports that she has never smoked. She has never used smokeless tobacco. She reports that she does not drink alcohol or use illicit drugs.  Family History  Problem Relation Age of Onset  . Hypertension Mother   . Hypertension Father   . Heart disease Father   . Heart attack Father     Review of Systems: Noncontributory  PHYSICAL EXAM: Blood pressure 110/70, height 4\' 11"  (1.499 m), weight 154 lb (69.854 kg), last menstrual period 09/07/2015, not currently breastfeeding. General appearance - alert, well appearing, and in no distress Chest - clear to auscultation, no wheezes, rales or rhonchi, symmetric air entry Heart - normal rate and regular rhythm Abdomen - soft, nontender, nondistended, no masses or  organomegaly Pelvic - Normal external female genitalia. Vagina is pink and rugated.  Normal discharge. Normal appearing secretions. Normal cervix contour. Uterus is normal in size. Uterus is tiny. No adnexal mass or tenderness.  Extremities - peripheral pulses normal, no pedal edema, no clubbing or cyanosis  Labs: No results found for this or any previous visit (from the past 336 hour(s)).  Imaging Studies: No results found.  Assessment: Patient Active Problem List   Diagnosis Date Noted  . Family planning services 08/22/2015  . Normal vaginal delivery 07/07/2014    Plan: Patient will undergo surgical management with BTL. Laparoscopic bilateral salpingectomy described to pt, including anesthesia, surgical process, risks, Schedule BTL, Bilateral salpingectomy , to scheduler today.    .mec 09/20/2015 10:40 AM    By signing my name below, I, Doreatha MartinEva Mathews, attest that this documentation has been prepared under the direction and in the presence of Tilda BurrowJohn V Carole Doner, MD. Electronically Signed: Doreatha MartinEva Mathews, ED Scribe. 09/20/2015. 10:40 AM.  I personally performed the services described in this documentation, which was SCRIBED in my presence. The recorded information has been reviewed and considered accurate. It has been edited as necessary during review. Tilda BurrowFERGUSON,Konor Noren V, MD

## 2015-09-20 NOTE — H&P (Signed)
Patient ID: Sandra Brewer, female DOB: 05/26/1990, 25 y.o. MRN: [id number]  Preoperative History and Physical  Sandra Brewer is a 25 y.o. G2P2002 here for surgical management of permanent sterilization. No significant preoperative concerns. LMP 09/07/15. She is currently using oral contraceptives. She denies cough, SOB, rhinorrhea, congestion, fever, chills, abdominal pain, nausea, emesis, diarrhea.   Proposed surgery: BTL bilateral salpingectomy  Past Medical History  Diagnosis Date  . Gall stones    History reviewed. No pertinent past surgical history. OB History  Gravida Para Term Preterm AB SAB TAB Ectopic Multiple Living  2 2 2      0 2    # Outcome Date GA Lbr Len/2nd Weight Sex Delivery Anes PTL Lv  2 Term 07/07/14 [redacted]w[redacted]d 03:52 / 00:20 6 lb 13.5 oz (3.105 kg) F Vag-Spont None  Y  1 Term 01/09/10 [redacted]w[redacted]d   F Vag-Spont  N Y    Patient denies any other pertinent gynecologic issues.   Current Outpatient Prescriptions on File Prior to Visit  Medication Sig Dispense Refill  . VIORELE 0.15-0.02/0.01 MG (21/5) tablet Take 1 tablet by mouth daily. 1 Package 0   No current facility-administered medications on file prior to visit.   No Known Allergies  Social History:  reports that she has never smoked. She has never used smokeless tobacco. She reports that she does not drink alcohol or use illicit drugs.  Family History  Problem Relation Age of Onset  . Hypertension Mother   . Hypertension Father   . Heart disease Father   . Heart attack Father     Review of Systems: Noncontributory  PHYSICAL EXAM: Blood pressure 110/70, height 4' 11" (1.499 m), weight 154 lb (69.854 kg), last menstrual period 09/07/2015, not currently breastfeeding. General appearance - alert, well appearing, and in no distress Chest - clear to auscultation, no wheezes, rales or rhonchi, symmetric air  entry Heart - normal rate and regular rhythm Abdomen - soft, nontender, nondistended, no masses or organomegaly Pelvic - Normal external female genitalia. Vagina is pink and rugated. Normal discharge. Normal appearing secretions. Normal cervix contour. Uterus is normal in size. Uterus is tiny. No adnexal mass or tenderness.  Extremities - peripheral pulses normal, no pedal edema, no clubbing or cyanosis  Labs: No results found for this or any previous visit (from the past 336 hour(s)).  Imaging Studies:  Imaging Results    No results found.    Assessment: Patient Active Problem List   Diagnosis Date Noted  . Family planning services 08/22/2015  . Normal vaginal delivery 07/07/2014    Plan: Patient will undergo surgical management with BTL. Laparoscopic bilateral salpingectomy described to pt, including anesthesia, surgical process, risks, Schedule BTL, Bilateral salpingectomy , to scheduler today.    .mec      

## 2015-10-03 NOTE — Patient Instructions (Signed)
Sandra Brewer  10/03/2015     @PREFPERIOPPHARMACY @   Your procedure is scheduled on 10/10/2015  Report to [facility] at 6:15 A.M.  Call this number if you have problems the morning of surgery:  [phone]   Remember:  Do not eat food or drink liquids after midnight.  Take these medicines the morning of surgery with A SIP OF WATER *Viorele  Do not wear jewelry, make-up or nail polish.  Do not wear lotions, powders, or perfumes.  You may wear deoderant.  Do not shave 48 hours prior to surgery.  Men may shave face and neck.  Do not bring valuables to the hospital.  [facility] is not responsible for any belongings or valuables.  Contacts, dentures or bridgework may not be worn into surgery.  Leave your suitcase in the car.  After surgery it may be brought to your room.  For patients admitted to the hospital, discharge time will be determined by your treatment team.  Patients discharged the day of surgery will not be allowed to drive home.    Please read over the following fact sheets that you were given. Surgical Site Infection Prevention and Anesthesia Post-op Instructions     PATIENT INSTRUCTIONS POST-ANESTHESIA  IMMEDIATELY FOLLOWING SURGERY:  Do not drive or operate machinery for the first twenty four hours after surgery.  Do not make any important decisions for twenty four hours after surgery or while taking narcotic pain medications or sedatives.  If you develop intractable nausea and vomiting or a severe headache please notify your doctor immediately.  FOLLOW-UP:  Please make an appointment with your surgeon as instructed. You do not need to follow up with anesthesia unless specifically instructed to do so.  WOUND CARE INSTRUCTIONS (if applicable):  Keep a dry clean dressing on the anesthesia/puncture wound site if there is drainage.  Once the wound has quit draining you may leave it open to air.  Generally you should leave the bandage intact for twenty four hours unless  there is drainage.  If the epidural site drains for more than 36-48 hours please call the anesthesia department.  QUESTIONS?:  Please feel free to call your physician or the hospital operator if you have any questions, and they will be happy to assist you.      Salpingectomy Salpingectomy, also called tubectomy, is the surgical removal of one of the fallopian tubes. The fallopian tubes are tubes that are connected to the uterus. These tubes transport the egg from the ovary to the uterus. A salpingectomy may be done for various reasons, including:   A tubal (ectopic) pregnancy. This is especially true if the tube ruptures.  An infected fallopian tube.  The need to remove the fallopian tube when removing an ovary with a cyst or tumor.  The need to remove the fallopian tube when removing the uterus.  Cancer of the fallopian tube or nearby organs. Removing one fallopian tube does not prevent you from becoming pregnant. It also does not cause problems with your menstrual periods.  LET [facility] CARE PROVIDER KNOW ABOUT:  Any allergies you have.  All medicines you are taking, including vitamins, herbs, eye drops, creams, and over-the-counter medicines.  Previous problems you or members of your family have had with the use of anesthetics.  Any blood disorders you have.  Previous surgeries you have had.  Medical conditions you have. RISKS AND COMPLICATIONS  Generally, this is a safe procedure. However, as with any procedure, complications can occur. Possible  complications include:  Injury to surrounding organs.  Bleeding.  Infection.  Problems related to anesthesia. BEFORE THE PROCEDURE  Ask your health care provider about changing or stopping your regular medicines. You may need to stop taking certain medicines, such as aspirin or blood thinners, at least 1 week before the surgery.  Do not eat or drink anything for at least 8 hours before the surgery.  If you smoke, do not  smoke for at least 2 weeks before the surgery.  Make plans to have someone drive you home after the procedure or after your hospital stay. Also arrange for someone to help you with activities during recovery. PROCEDURE   You will be given medicine to help you relax before the procedure (sedative). You will then be given medicine to make you sleep through the procedure (general anesthetic). These medicines will be given through an IV access tube that is put into one of your veins.  Once you are asleep, your lower abdomen will be shaved and cleaned. A thin, flexible tube (catheter) will be placed in your bladder.  The surgeon may use a laparoscopic, robotic, or open technique for this surgery:  In the laparoscopic technique, the surgery is done through two small cuts (incisions) in the abdomen. A thin, lighted tube with a tiny camera on the end (laparoscope) is inserted into one of the incisions. The tools needed for the procedure are put through the other incision.  A robotic technique may be chosen to perform complex surgery in a small space. In the robotic technique, small incisions will be made. A camera and surgical instruments are passed through the incisions. Surgical instruments will be controlled with the help of a robotic arm.  In the open technique, the surgery is done through one large incision in the abdomen.  Using any of these techniques, the surgeon removes the fallopian tube from where it attaches to the uterus. The blood vessels will be clamped and tied.  The surgeon then uses staples or stitches to close the incision or incisions. AFTER THE PROCEDURE   You will be taken to a recovery area where your progress will be monitored for 1-3 hours.  If the laparoscopic technique was used, you may be allowed to go home after several hours. You may have some shoulder pain after the laparoscopic procedure. This is normal and usually goes away in a day or two.  If the open technique  was used, you will be admitted to the hospital for a couple of days.  You will be given pain medicine if needed.  The IV access tube and catheter will be removed before you are discharged.   This information is not intended to replace advice given to you by your health care provider. Make sure you discuss any questions you have with your health care provider.   Document Released: 07/07/2008 Document Revised: 03/11/2014 Document Reviewed: 08/12/2012 Elsevier Interactive Patient Education [employer]

## 2015-10-05 ENCOUNTER — Encounter (HOSPITAL_COMMUNITY): Payer: Self-pay

## 2015-10-05 ENCOUNTER — Encounter (HOSPITAL_COMMUNITY)
Admission: RE | Admit: 2015-10-05 | Discharge: 2015-10-05 | Disposition: A | Discharge status: Home / Self Care | Payer: Medicaid Other | Source: Ambulatory Visit | Attending: Obstetrics and Gynecology | Admitting: Obstetrics and Gynecology

## 2015-10-05 DIAGNOSIS — Z01812 Encounter for preprocedural laboratory examination: Secondary | ICD-10-CM | POA: Insufficient documentation

## 2015-10-05 LAB — BASIC METABOLIC PANEL
ANION GAP: 6 (ref 5–15)
BUN: 9 mg/dL (ref 6–20)
CALCIUM: 9.3 mg/dL (ref 8.9–10.3)
CO2: 25 mmol/L (ref 22–32)
Chloride: 104 mmol/L (ref 101–111)
Creatinine, Ser: 0.61 mg/dL (ref 0.44–1.00)
GLUCOSE: 99 mg/dL (ref 65–99)
Potassium: 4.5 mmol/L (ref 3.5–5.1)
Sodium: 135 mmol/L (ref 135–145)

## 2015-10-05 LAB — URINE MICROSCOPIC-ADD ON

## 2015-10-05 LAB — URINALYSIS, ROUTINE W REFLEX MICROSCOPIC
BILIRUBIN URINE: NEGATIVE
Glucose, UA: NEGATIVE mg/dL
Ketones, ur: NEGATIVE mg/dL
LEUKOCYTES UA: NEGATIVE
NITRITE: NEGATIVE
PROTEIN: NEGATIVE mg/dL
pH: 5.5 (ref 5.0–8.0)

## 2015-10-05 LAB — HCG, SERUM, QUALITATIVE: PREG SERUM: NEGATIVE

## 2015-10-05 LAB — CBC
HEMATOCRIT: 38 % (ref 36.0–46.0)
HEMOGLOBIN: 12.5 g/dL (ref 12.0–15.0)
MCH: 30.5 pg (ref 26.0–34.0)
MCHC: 32.9 g/dL (ref 30.0–36.0)
MCV: 92.7 fL (ref 78.0–100.0)
Platelets: 372 10*3/uL (ref 150–400)
RBC: 4.1 MIL/uL (ref 3.87–5.11)
RDW: 12.3 % (ref 11.5–15.5)
WBC: 8.3 10*3/uL (ref 4.0–10.5)

## 2015-10-10 ENCOUNTER — Ambulatory Visit (HOSPITAL_COMMUNITY): Payer: Medicaid Other | Admitting: Anesthesiology

## 2015-10-10 ENCOUNTER — Ambulatory Visit (HOSPITAL_COMMUNITY)
Admission: RE | Admit: 2015-10-10 | Discharge: 2015-10-10 | Disposition: A | Discharge status: Home / Self Care | Payer: Medicaid Other | Source: Ambulatory Visit | Attending: Obstetrics and Gynecology | Admitting: Obstetrics and Gynecology

## 2015-10-10 ENCOUNTER — Encounter (HOSPITAL_COMMUNITY)
Admission: RE | Disposition: A | Discharge status: Home / Self Care | Payer: Self-pay | Source: Ambulatory Visit | Attending: Obstetrics and Gynecology

## 2015-10-10 ENCOUNTER — Encounter (HOSPITAL_COMMUNITY): Payer: Self-pay | Admitting: *Deleted

## 2015-10-10 DIAGNOSIS — Z79899 Other long term (current) drug therapy: Secondary | ICD-10-CM | POA: Diagnosis not present

## 2015-10-10 DIAGNOSIS — Z8249 Family history of ischemic heart disease and other diseases of the circulatory system: Secondary | ICD-10-CM | POA: Diagnosis not present

## 2015-10-10 DIAGNOSIS — Z302 Encounter for sterilization: Secondary | ICD-10-CM | POA: Diagnosis not present

## 2015-10-10 HISTORY — PX: LAPAROSCOPIC BILATERAL SALPINGECTOMY: SHX5889

## 2015-10-10 SURGERY — SALPINGECTOMY, BILATERAL, LAPAROSCOPIC
Anesthesia: General | Site: Abdomen | Laterality: Bilateral

## 2015-10-10 MED ORDER — FENTANYL CITRATE (PF) 250 MCG/5ML IJ SOLN
INTRAMUSCULAR | Status: AC
  Filled 2015-10-10: qty 5

## 2015-10-10 MED ORDER — ONDANSETRON HCL 4 MG/2ML IJ SOLN
INTRAMUSCULAR | Status: AC
  Filled 2015-10-10: qty 2

## 2015-10-10 MED ORDER — NEOSTIGMINE METHYLSULFATE 10 MG/10ML IV SOLN
INTRAVENOUS | Status: DC | PRN
  Administered 2015-10-10: 4 mg via INTRAVENOUS

## 2015-10-10 MED ORDER — FENTANYL CITRATE (PF) 100 MCG/2ML IJ SOLN
INTRAMUSCULAR | Status: DC | PRN
  Administered 2015-10-10: 100 ug via INTRAVENOUS
  Administered 2015-10-10 (×3): 50 ug via INTRAVENOUS

## 2015-10-10 MED ORDER — LIDOCAINE HCL (CARDIAC) 10 MG/ML IV SOLN
INTRAVENOUS | Status: DC | PRN
  Administered 2015-10-10: 50 mg via INTRAVENOUS

## 2015-10-10 MED ORDER — MIDAZOLAM HCL 2 MG/2ML IJ SOLN
INTRAMUSCULAR | Status: AC
  Filled 2015-10-10: qty 2

## 2015-10-10 MED ORDER — ROCURONIUM BROMIDE 50 MG/5ML IV SOLN
INTRAVENOUS | Status: AC
  Filled 2015-10-10: qty 2

## 2015-10-10 MED ORDER — PROPOFOL 10 MG/ML IV BOLUS
INTRAVENOUS | Status: AC
  Filled 2015-10-10: qty 20

## 2015-10-10 MED ORDER — ONDANSETRON HCL 4 MG/2ML IJ SOLN
4.0000 mg | Freq: Once | INTRAMUSCULAR | Status: AC | PRN
  Administered 2015-10-10: 4 mg via INTRAVENOUS
  Filled 2015-10-10: qty 2

## 2015-10-10 MED ORDER — ROCURONIUM BROMIDE 100 MG/10ML IV SOLN
INTRAVENOUS | Status: DC | PRN
  Administered 2015-10-10: 25 mg via INTRAVENOUS
  Administered 2015-10-10: 5 mg via INTRAVENOUS

## 2015-10-10 MED ORDER — GLYCOPYRROLATE 0.2 MG/ML IJ SOLN
INTRAMUSCULAR | Status: DC | PRN
  Administered 2015-10-10: 0.6 mg via INTRAVENOUS

## 2015-10-10 MED ORDER — LIDOCAINE HCL (PF) 1 % IJ SOLN
INTRAMUSCULAR | Status: AC
  Filled 2015-10-10: qty 15

## 2015-10-10 MED ORDER — FENTANYL CITRATE (PF) 100 MCG/2ML IJ SOLN
25.0000 ug | INTRAMUSCULAR | Status: DC | PRN
  Administered 2015-10-10 (×4): 50 ug via INTRAVENOUS
  Filled 2015-10-10 (×2): qty 2

## 2015-10-10 MED ORDER — GLYCOPYRROLATE 0.2 MG/ML IJ SOLN
INTRAMUSCULAR | Status: AC
  Filled 2015-10-10: qty 3

## 2015-10-10 MED ORDER — FENTANYL CITRATE (PF) 100 MCG/2ML IJ SOLN
INTRAMUSCULAR | Status: AC
  Filled 2015-10-10: qty 2

## 2015-10-10 MED ORDER — DEXAMETHASONE SODIUM PHOSPHATE 4 MG/ML IJ SOLN
4.0000 mg | Freq: Once | INTRAMUSCULAR | Status: AC
  Administered 2015-10-10: 4 mg via INTRAVENOUS

## 2015-10-10 MED ORDER — TRAMADOL HCL 50 MG PO TABS: 50.0000 mg | ORAL_TABLET | Freq: Four times a day (QID) | ORAL | 0 refills | Status: DC | PRN

## 2015-10-10 MED ORDER — PROPOFOL 10 MG/ML IV BOLUS
INTRAVENOUS | Status: DC | PRN
  Administered 2015-10-10: 20 mg via INTRAVENOUS
  Administered 2015-10-10: 30 mg via INTRAVENOUS
  Administered 2015-10-10: 150 mg via INTRAVENOUS

## 2015-10-10 MED ORDER — LACTATED RINGERS IV SOLN
INTRAVENOUS | Status: DC
  Administered 2015-10-10: 07:00:00 via INTRAVENOUS

## 2015-10-10 MED ORDER — ONDANSETRON HCL 4 MG/2ML IJ SOLN
4.0000 mg | Freq: Once | INTRAMUSCULAR | Status: AC
  Administered 2015-10-10: 4 mg via INTRAVENOUS

## 2015-10-10 MED ORDER — DEXAMETHASONE SODIUM PHOSPHATE 4 MG/ML IJ SOLN
INTRAMUSCULAR | Status: AC
  Filled 2015-10-10: qty 1

## 2015-10-10 MED ORDER — 0.9 % SODIUM CHLORIDE (POUR BTL) OPTIME
TOPICAL | Status: DC | PRN
  Administered 2015-10-10: 1000 mL

## 2015-10-10 MED ORDER — MIDAZOLAM HCL 2 MG/2ML IJ SOLN
1.0000 mg | INTRAMUSCULAR | Status: DC | PRN
  Administered 2015-10-10 (×2): 1 mg via INTRAVENOUS

## 2015-10-10 MED ORDER — NEOSTIGMINE METHYLSULFATE 10 MG/10ML IV SOLN
INTRAVENOUS | Status: AC
  Filled 2015-10-10: qty 1

## 2015-10-10 SURGICAL SUPPLY — 39 items
BAG HAMPER (MISCELLANEOUS) ×3 IMPLANT
BANDAGE STRIP 1X3 FLEXIBLE (GAUZE/BANDAGES/DRESSINGS) ×9 IMPLANT
BLADE SURG SZ11 CARB STEEL (BLADE) ×3 IMPLANT
CATH ROBINSON RED A/P 16FR (CATHETERS) ×3 IMPLANT
CLOSURE STERI-STRIP 1/4X4 (GAUZE/BANDAGES/DRESSINGS) ×3 IMPLANT
CLOSURE WOUND 1/4 X3 (GAUZE/BANDAGES/DRESSINGS) ×1
CLOTH BEACON ORANGE TIMEOUT ST (SAFETY) ×3 IMPLANT
COVER LIGHT HANDLE STERIS (MISCELLANEOUS) ×6 IMPLANT
DECANTER SPIKE VIAL GLASS SM (MISCELLANEOUS) ×3 IMPLANT
DURAPREP 26ML APPLICATOR (WOUND CARE) ×3 IMPLANT
ELECT REM PT RETURN 9FT ADLT (ELECTROSURGICAL) ×3
ELECTRODE REM PT RTRN 9FT ADLT (ELECTROSURGICAL) ×1 IMPLANT
FORMALIN 10 PREFIL 120ML (MISCELLANEOUS) ×3 IMPLANT
GLOVE BIOGEL PI IND STRL 7.0 (GLOVE) ×1 IMPLANT
GLOVE BIOGEL PI IND STRL 9 (GLOVE) ×1 IMPLANT
GLOVE BIOGEL PI INDICATOR 7.0 (GLOVE) ×2
GLOVE BIOGEL PI INDICATOR 9 (GLOVE) ×2
GLOVE ECLIPSE 9.0 STRL (GLOVE) ×6 IMPLANT
GOWN SPEC L3 XXLG W/TWL (GOWN DISPOSABLE) ×3 IMPLANT
GOWN STRL REUS W/TWL LRG LVL3 (GOWN DISPOSABLE) ×3 IMPLANT
INST SET LAPROSCOPIC GYN AP (KITS) ×3 IMPLANT
KIT ROOM TURNOVER APOR (KITS) ×3 IMPLANT
NEEDLE INSUFFLATION 120MM (ENDOMECHANICALS) ×3 IMPLANT
NS IRRIG 1000ML POUR BTL (IV SOLUTION) ×3 IMPLANT
PACK PERI GYN (CUSTOM PROCEDURE TRAY) ×3 IMPLANT
PAD ARMBOARD 7.5X6 YLW CONV (MISCELLANEOUS) ×3 IMPLANT
SET BASIN LINEN APH (SET/KITS/TRAYS/PACK) ×3 IMPLANT
SET IRRIG TUBING LAPAROSCOPIC (IRRIGATION / IRRIGATOR) IMPLANT
SHEARS HARMONIC ACE PLUS 36CM (ENDOMECHANICALS) ×3 IMPLANT
SLEEVE ENDOPATH XCEL 5M (ENDOMECHANICALS) ×6 IMPLANT
SOLUTION ANTI FOG 6CC (MISCELLANEOUS) ×3 IMPLANT
STRIP CLOSURE SKIN 1/4X3 (GAUZE/BANDAGES/DRESSINGS) ×2 IMPLANT
SUT VIC AB 4-0 PS2 27 (SUTURE) ×3 IMPLANT
SYR 30ML LL (SYRINGE) ×3 IMPLANT
SYR BULB IRRIGATION 50ML (SYRINGE) ×3 IMPLANT
SYRINGE 10CC LL (SYRINGE) ×3 IMPLANT
TROCAR XCEL NON-BLD 5MMX100MML (ENDOMECHANICALS) ×3 IMPLANT
TUBING INSUFFLATION (TUBING) ×3 IMPLANT
WARMER LAPAROSCOPE (MISCELLANEOUS) ×3 IMPLANT

## 2015-10-10 NOTE — Brief Op Note (Signed)
10/10/2015  8:29 AM  PATIENT:  Sandra Brewer  25 y.o. female  PRE-OPERATIVE DIAGNOSIS:  sterilization request  POST-OPERATIVE DIAGNOSIS:  Sterilization  PROCEDURE:  Procedure(s): LAPAROSCOPIC BILATERAL SALPINGECTOMY (Bilateral)  SURGEON:  Surgeon(s) and Role:    * Tilda BurrowJohn V Micheil Klaus, MD - Primary  PHYSICIAN ASSISTANT:   ASSISTANTS: Marya LandryMaggie Henderson CST   ANESTHESIA:   general Dennison Mascotheresa Yates CRNA  EBL:  Total I/O In: 900 [I.V.:900] Out: 60 [Urine:50; Blood:10]  BLOOD ADMINISTERED:none  DRAINS: none   LOCAL MEDICATIONS USED:  NONE antibiotics were not necessary as salpingectomy is not a skipp case requiring antibiotics  SPECIMEN:  Source of Specimen:  Bilateral fallopian tubes  DISPOSITION OF SPECIMEN:  PATHOLOGY  COUNTS:  YES  TOURNIQUET:  * No tourniquets in log *  DICTATION: .Dragon Dictation  PLAN OF CARE: Admit to inpatient   PATIENT DISPOSITION:  PACU - hemodynamically stable.   Delay start of Pharmacological VTE agent (>24hrs) due to surgical blood loss or risk of bleeding: not applicable Details of procedure. Patient was taken operating room prepped and draped for combined abdominal and vaginal procedure with general anesthesia introduced endotracheal intubation and timeout conducted by surgical team. Procedure was confirmed. Hulka tenaculum was attached to the cervix and in and out catheterization the bladder emptied 50 cc of urine. Attention was then directed to the umbilicus where a midline vertical 1 cm skin incision was made as well as transverse suprapubic and right lower quadrant incisions of similar length. The Veress needle was used to enter the abdominal cavity very carefully while elevating the abdominal wall and orienting the needle to the pelvis. The umbilicus area abdominal wall was quite thin. The peritoneum was easily achieved under 8 mm intra-abdominal pressure 3 L CO2 followed by direct insertion of the laparoscope with the's which showed no evidence of  trauma to internal organs. Suprapubic and right lower quadrant trochars were inserted under direct visualization, and attention directed to the left fallopian tube which was elevated and the Harmonic scalpel used to perform salpingectomy, using the slow Harmonic Ace coagulation settings and specimen was extracted through the suprapubic trocar. Right fallopian tube was treated similarly, and photos before and after confirmed the changes of the procedure. The abdomen was hemostatic, instruments were removed after placing 120 cc of saline in the abdomen and deflating the abdomen being careful to remove as much carbon dioxide as possible, with subcuticular 4-0 Vicryl closure of the skin at all 3 suture sites. Sponge and needle counts were correct patient went to recovery room in stable condition and will be discharged home today

## 2015-10-10 NOTE — Op Note (Signed)
Please see the brief operative note for surgical details 

## 2015-10-10 NOTE — H&P (View-Only) (Signed)
Patient ID: Sandra JacobsonDonna Brewer, female DOB: 11/23/1990, 25 y.o. MRN: [id number]  Preoperative History and Physical  Sandra Brewer is a 25 y.o. [id number] here for surgical management of permanent sterilization. No significant preoperative concerns. LMP 09/07/15. She is currently using oral contraceptives. She denies cough, SOB, rhinorrhea, congestion, fever, chills, abdominal pain, nausea, emesis, diarrhea.   Proposed surgery: BTL bilateral salpingectomy  Past Medical History  Diagnosis Date  . Gall stones    History reviewed. No pertinent past surgical history. OB History  Gravida Para Term Preterm AB SAB TAB Ectopic Multiple Living  2 2 2       0 2    # Outcome Date GA Lbr Len/2nd Weight Sex Delivery Anes PTL Lv  2 Term 07/07/14 5049w3d 03:52 / 00:20 6 lb 13.5 oz (3.105 kg) F Vag-Spont None  Y  1 Term 01/09/10 3870w0d   F Vag-Spont  N Y    Patient denies any other pertinent gynecologic issues.   Current Outpatient Prescriptions on File Prior to Visit  Medication Sig Dispense Refill  . VIORELE 0.15-0.02/0.01 MG (21/5) tablet Take 1 tablet by mouth daily. 1 Package 0   No current facility-administered medications on file prior to visit.   No Known Allergies  Social History:  reports that she has never smoked. She has never used smokeless tobacco. She reports that she does not drink alcohol or use illicit drugs.  Family History  Problem Relation Age of Onset  . Hypertension Mother   . Hypertension Father   . Heart disease Father   . Heart attack Father     Review of Systems: Noncontributory  PHYSICAL EXAM: Blood pressure 110/70, height 4\' 11"  (1.499 m), weight 154 lb (69.854 kg), last menstrual period 09/07/2015, not currently breastfeeding. General appearance - alert, well appearing, and in no distress Chest - clear to auscultation, no wheezes, rales or rhonchi, symmetric air  entry Heart - normal rate and regular rhythm Abdomen - soft, nontender, nondistended, no masses or organomegaly Pelvic - Normal external female genitalia. Vagina is pink and rugated. Normal discharge. Normal appearing secretions. Normal cervix contour. Uterus is normal in size. Uterus is tiny. No adnexal mass or tenderness.  Extremities - peripheral pulses normal, no pedal edema, no clubbing or cyanosis  Labs: No results found for this or any previous visit (from the past 336 hour(s)).  Imaging Studies:  Imaging Results    No results found.    Assessment: Patient Active Problem List   Diagnosis Date Noted  . Family planning services 08/22/2015  . Normal vaginal delivery 07/07/2014    Plan: Patient will undergo surgical management with BTL. Laparoscopic bilateral salpingectomy described to pt, including anesthesia, surgical process, risks, Schedule BTL, Bilateral salpingectomy , to scheduler today.    .mec

## 2015-10-10 NOTE — Anesthesia Postprocedure Evaluation (Signed)
Anesthesia Post Note  Patient: Myriam JacobsonDonna Bumpass  Procedure(s) Performed: Procedure(s) (LRB): LAPAROSCOPIC BILATERAL SALPINGECTOMY (Bilateral)  Patient location during evaluation: PACU Anesthesia Type: General Level of consciousness: awake and alert Pain management: satisfactory to patient Vital Signs Assessment: post-procedure vital signs reviewed and stable Respiratory status: spontaneous breathing Cardiovascular status: stable Anesthetic complications: no    Last Vitals:  Vitals:   10/10/15 0913 10/10/15 0933  BP: 123/60 118/76  Pulse:  (!) 110  Resp:  18  Temp:  36.7 C    Last Pain:  Vitals:   10/10/15 0933  TempSrc: Oral  PainSc: 4                  Khaiden Segreto

## 2015-10-10 NOTE — Anesthesia Preprocedure Evaluation (Signed)
Anesthesia Evaluation  Patient identified by MRN, date of birth, ID band Patient awake    Reviewed: Allergy & Precautions, NPO status , Patient's Chart, lab work & pertinent test results  Airway Mallampati: I  TM Distance: >3 FB     Dental  (+) Teeth Intact, Dental Advisory Given   Pulmonary neg pulmonary ROS,  breath sounds clear to auscultation        Cardiovascular negative cardio ROS  Rhythm:Regular Rate:Normal     Neuro/Psych    GI/Hepatic negative GI ROS,   Endo/Other    Renal/GU      Musculoskeletal   Abdominal   Peds  Hematology   Anesthesia Other Findings   Reproductive/Obstetrics                             Anesthesia Physical Anesthesia Plan  ASA: I  Anesthesia Plan: General   Post-op Pain Management:    Induction: Intravenous  Airway Management Planned: Oral ETT  Additional Equipment:   Intra-op Plan:   Post-operative Plan: Extubation in OR  Informed Consent: I have reviewed the patients History and Physical, chart, labs and discussed the procedure including the risks, benefits and alternatives for the proposed anesthesia with the patient or authorized representative who has indicated his/her understanding and acceptance.     Plan Discussed with:   Anesthesia Plan Comments:         Anesthesia Quick Evaluation  

## 2015-10-10 NOTE — Transfer of Care (Signed)
Immediate Anesthesia Transfer of Care Note  Patient: Sandra Brewer  Procedure(s) Performed: Procedure(s) (LRB): LAPAROSCOPIC BILATERAL SALPINGECTOMY (Bilateral)  Patient Location: Shortstay  Anesthesia Type: MAC  Level of Consciousness: awake  Airway & Oxygen Therapy: Patient Spontanous Breathing   Post-op Assessment: Report given to PACU RN, Post -op Vital signs reviewed and stable and Patient moving all extremities  Post vital signs: Reviewed and stable  Complications: No apparent anesthesia complications

## 2015-10-10 NOTE — Anesthesia Procedure Notes (Signed)
Procedure Name: Intubation Date/Time: 10/10/2015 7:44 AM Performed by: Franco NonesYATES, Koya Hunger S Pre-anesthesia Checklist: Patient identified, Patient being monitored, Timeout performed, Emergency Drugs available and Suction available Patient Re-evaluated:Patient Re-evaluated prior to inductionOxygen Delivery Method: Circle system utilized Preoxygenation: Pre-oxygenation with 100% oxygen Intubation Type: IV induction Ventilation: Mask ventilation without difficulty Laryngoscope Size: Mac and 3 Grade View: Grade I Tube type: Oral Tube size: 7.0 mm Number of attempts: 1 Airway Equipment and Method: Stylet Placement Confirmation: ETT inserted through vocal cords under direct vision,  positive ETCO2 and breath sounds checked- equal and bilateral Secured at: 22 cm Tube secured with: Tape Dental Injury: Teeth and Oropharynx as per pre-operative assessment

## 2015-10-10 NOTE — Discharge Instructions (Signed)
Salpingectomy, Care After Refer to this sheet in the next few weeks. These instructions provide you with information on caring for yourself after your procedure. Your health care provider may also give you more specific instructions. Your treatment has been planned according to current medical practices, but problems sometimes occur. Call your health care provider if you have any problems or questions after your procedure. WHAT TO EXPECT AFTER THE PROCEDURE After your procedure, it is typical to have the following:  Abdominal pain that can be controlled with pain medicine.  Vaginal spotting.  Tiredness. HOME CARE INSTRUCTIONS  Get plenty of rest and sleep.  Only take over-the-counter or prescription medicines as directed by your health care provider.  Keep incision areas clean and dry. Remove or change any bandages (dressings) only as directed by your health care provider.  You may resume your regular diet. Eat a well-balanced diet.  Drink enough fluids to keep your urine clear or pale yellow.  Limit exercise and activities as directed by your health care provider. Do not lift anything heavier than 5 lb (2.3 kg) until your health care provider approves, usually a week.  Do not drive until your health care provider approves.  Do not have sexual intercourse until your health care provider says it is okay.  Take your temperature twice a day for the first week. Write those temperatures down.  Follow up with your health care provider as directed. SEEK MEDICAL CARE IF:  You have pain when you urinate.  You see pus coming out of the incision, or the incision is separating.  You have increasing abdominal pain.  You have swelling or redness in the incision area.  You develop a rash.  You feel lightheaded.  You have pain that is not controlled with medicine. SEEK IMMEDIATE MEDICAL CARE IF:  You develop a fever.  You have increasing abdominal pain.  You develop chest or leg  pain.  You develop shortness of breath.  You pass out.   This information is not intended to replace advice given to you by your health care provider. Make sure you discuss any questions you have with your health care provider.   Document Released: 05/25/2010 Document Revised: 03/11/2014 Document Reviewed: 08/12/2012 Elsevier Interactive Patient Education [employer]2016 Elsevier Inc.

## 2015-10-10 NOTE — Interval H&P Note (Signed)
History and Physical Interval Note:  10/10/2015 7:10 AM  Sandra Brewer  has presented today for surgery, with the diagnosis of sterilization request  The various methods of treatment have been discussed with the patient and family. After consideration of risks, benefits and other options for treatment, the patient has consented to  Procedure(s): LAPAROSCOPIC BILATERAL SALPINGECTOMY (Bilateral) as a surgical intervention .  The patient's history has been reviewed, patient examined, no change in status, stable for surgery.  I have reviewed the patient's chart and labs.  Questions were answered to the patient's satisfaction.  Husband present and supportive   Tilda BurrowFERGUSON,[name]

## 2015-10-13 ENCOUNTER — Encounter (HOSPITAL_COMMUNITY): Payer: Self-pay | Admitting: Obstetrics and Gynecology

## 2015-10-19 ENCOUNTER — Encounter: Payer: Self-pay | Admitting: Obstetrics and Gynecology

## 2015-10-19 ENCOUNTER — Ambulatory Visit (INDEPENDENT_AMBULATORY_CARE_PROVIDER_SITE_OTHER): Payer: Self-pay | Admitting: Obstetrics and Gynecology

## 2015-10-19 VITALS — BP 120/82 | Ht 59.0 in | Wt 156.0 lb

## 2015-10-19 DIAGNOSIS — Z09 Encounter for follow-up examination after completed treatment for conditions other than malignant neoplasm: Secondary | ICD-10-CM

## 2015-10-19 DIAGNOSIS — Z9889 Other specified postprocedural states: Secondary | ICD-10-CM

## 2015-10-19 DIAGNOSIS — Z3009 Encounter for other general counseling and advice on contraception: Secondary | ICD-10-CM

## 2015-10-19 DIAGNOSIS — Z1151 Encounter for screening for human papillomavirus (HPV): Secondary | ICD-10-CM

## 2015-10-19 NOTE — Progress Notes (Signed)
   Subjective:  Sandra Brewer is a 25 y.o. female now 9 days status post bilateral laproscopic salpingectomy. Pt denies any other symptoms at this time.   Review of Systems Negative except    Diet:   nl   Bowel movements : normal.  The patient is not having any pain.  Objective:  BP 120/82 (BP Location: Right Arm, Patient Position: Sitting, Cuff Size: Normal)   Ht 4\' 11"  (1.499 m)   Wt 156 lb (70.8 kg)   LMP 10/05/2015   BMI 31.51 kg/m  General:Well developed, well nourished.  No acute distress. Abdomen: Bowel sounds normal, soft, non-tender. Small asymptomatic 9 mm umbilical hernia.  Incision(s):   Healing well, no drainage, no erythema, no hernia, no swelling, no dehiscence,   Assessment:  Post-Op 9 days s/p bilateral laproscopic salpingectomy     Doing well postoperatively.   Plan:  1.Wound care discussed  2. . current medications. 3. Activity restrictions: none 4. return to work: 2-3 weeks. 5. Follow up in 3 years.   By signing my name below, I, Sandra Brewer, attest that this documentation has been prepared under the direction and in the presence of Sandra BurrowJohn Brewer Ryliegh Mcduffey, MD. Electronically Signed: [name], Sandra Brewer. 10/19/15. 2:20 PM.  I personally performed the services described in this documentation, which was SCRIBED in my presence. The recorded information has been reviewed and considered accurate. It has been edited as necessary during review. Sandra BurrowFERGUSON,Sandra Agosto V, MD

## 2015-11-18 ENCOUNTER — Encounter (HOSPITAL_COMMUNITY): Payer: Self-pay | Admitting: Emergency Medicine

## 2015-11-18 ENCOUNTER — Inpatient Hospital Stay (HOSPITAL_COMMUNITY)
Admission: EM | Admit: 2015-11-18 | Discharge: 2015-11-20 | DRG: 418 | Disposition: A | Discharge status: Home / Self Care | Payer: Medicaid Other | Attending: General Surgery | Admitting: General Surgery

## 2015-11-18 ENCOUNTER — Emergency Department (HOSPITAL_COMMUNITY): Payer: Medicaid Other

## 2015-11-18 DIAGNOSIS — K810 Acute cholecystitis: Secondary | ICD-10-CM

## 2015-11-18 DIAGNOSIS — K8000 Calculus of gallbladder with acute cholecystitis without obstruction: Secondary | ICD-10-CM | POA: Diagnosis present

## 2015-11-18 DIAGNOSIS — K821 Hydrops of gallbladder: Secondary | ICD-10-CM | POA: Diagnosis present

## 2015-11-18 DIAGNOSIS — R1011 Right upper quadrant pain: Secondary | ICD-10-CM

## 2015-11-18 DIAGNOSIS — Z8249 Family history of ischemic heart disease and other diseases of the circulatory system: Secondary | ICD-10-CM

## 2015-11-18 LAB — CBC
HEMATOCRIT: 37.4 % (ref 36.0–46.0)
Hemoglobin: 12.5 g/dL (ref 12.0–15.0)
MCH: 30.6 pg (ref 26.0–34.0)
MCHC: 33.4 g/dL (ref 30.0–36.0)
MCV: 91.7 fL (ref 78.0–100.0)
Platelets: 370 10*3/uL (ref 150–400)
RBC: 4.08 MIL/uL (ref 3.87–5.11)
RDW: 11.6 % (ref 11.5–15.5)
WBC: 10.6 10*3/uL — ABNORMAL HIGH (ref 4.0–10.5)

## 2015-11-18 LAB — URINALYSIS, ROUTINE W REFLEX MICROSCOPIC
BILIRUBIN URINE: NEGATIVE
GLUCOSE, UA: NEGATIVE mg/dL
Hgb urine dipstick: NEGATIVE
KETONES UR: NEGATIVE mg/dL
Leukocytes, UA: NEGATIVE
NITRITE: NEGATIVE
PH: 6.5 (ref 5.0–8.0)
Specific Gravity, Urine: 1.025 (ref 1.005–1.030)

## 2015-11-18 LAB — COMPREHENSIVE METABOLIC PANEL
ALBUMIN: 4.6 g/dL (ref 3.5–5.0)
ALT: 15 U/L (ref 14–54)
AST: 18 U/L (ref 15–41)
Alkaline Phosphatase: 62 U/L (ref 38–126)
Anion gap: 10 (ref 5–15)
BILIRUBIN TOTAL: 0.3 mg/dL (ref 0.3–1.2)
BUN: 14 mg/dL (ref 6–20)
CO2: 25 mmol/L (ref 22–32)
CREATININE: 0.57 mg/dL (ref 0.44–1.00)
Calcium: 9.2 mg/dL (ref 8.9–10.3)
Chloride: 99 mmol/L — ABNORMAL LOW (ref 101–111)
GFR calc Af Amer: >60 mL/min (ref >60)
GFR calc non Af Amer: >60 mL/min (ref >60)
GLUCOSE: 122 mg/dL — AB (ref 65–99)
POTASSIUM: 4.1 mmol/L (ref 3.5–5.1)
SODIUM: 134 mmol/L — AB (ref 135–145)
TOTAL PROTEIN: 8.1 g/dL (ref 6.5–8.1)

## 2015-11-18 LAB — LACTIC ACID, PLASMA: Lactic Acid, Venous: 1.4 mmol/L (ref 0.5–1.9)

## 2015-11-18 LAB — URINE MICROSCOPIC-ADD ON

## 2015-11-18 LAB — LIPASE, BLOOD: LIPASE: 16 U/L (ref 11–51)

## 2015-11-18 LAB — PROTIME-INR
INR: 0.95
PROTHROMBIN TIME: 12.7 s (ref 11.4–15.2)

## 2015-11-18 LAB — PREGNANCY, URINE: Preg Test, Ur: NEGATIVE

## 2015-11-18 MED ORDER — MORPHINE SULFATE (PF) 4 MG/ML IV SOLN
4.0000 mg | Freq: Once | INTRAVENOUS | Status: AC
Start: 2015-11-18 — End: 2015-11-18
  Administered 2015-11-18: 4 mg via INTRAVENOUS
  Filled 2015-11-18: qty 1

## 2015-11-18 MED ORDER — MORPHINE SULFATE (PF) 2 MG/ML IV SOLN
2.0000 mg | INTRAVENOUS | Status: DC | PRN
  Administered 2015-11-18 – 2015-11-19 (×3): 2 mg via INTRAVENOUS
  Filled 2015-11-18 (×3): qty 1

## 2015-11-18 MED ORDER — ACETAMINOPHEN 650 MG RE SUPP: 650.0000 mg | Freq: Four times a day (QID) | RECTAL | Status: DC | PRN

## 2015-11-18 MED ORDER — CIPROFLOXACIN IN D5W 400 MG/200ML IV SOLN
400.0000 mg | Freq: Once | INTRAVENOUS | Status: AC
  Administered 2015-11-18: 400 mg via INTRAVENOUS
  Filled 2015-11-18: qty 200

## 2015-11-18 MED ORDER — OXYCODONE HCL 5 MG PO TABS
5.0000 mg | ORAL_TABLET | ORAL | Status: DC | PRN
  Administered 2015-11-19 (×2): 5 mg via ORAL
  Filled 2015-11-18 (×2): qty 1

## 2015-11-18 MED ORDER — ONDANSETRON HCL 4 MG/2ML IJ SOLN
4.0000 mg | Freq: Four times a day (QID) | INTRAMUSCULAR | Status: DC | PRN
  Filled 2015-11-18: qty 2

## 2015-11-18 MED ORDER — ONDANSETRON HCL 4 MG PO TABS: 4.0000 mg | ORAL_TABLET | Freq: Four times a day (QID) | ORAL | Status: DC | PRN

## 2015-11-18 MED ORDER — MORPHINE SULFATE (PF) 4 MG/ML IV SOLN
4.0000 mg | Freq: Once | INTRAVENOUS | Status: AC
  Administered 2015-11-18: 4 mg via INTRAVENOUS
  Filled 2015-11-18: qty 1

## 2015-11-18 MED ORDER — SODIUM CHLORIDE 0.9 % IV SOLN
INTRAVENOUS | Status: DC
  Administered 2015-11-18 – 2015-11-19 (×2): via INTRAVENOUS

## 2015-11-18 MED ORDER — ONDANSETRON HCL 4 MG/2ML IJ SOLN
4.0000 mg | Freq: Once | INTRAMUSCULAR | Status: AC
  Administered 2015-11-18: 4 mg via INTRAVENOUS
  Filled 2015-11-18: qty 2

## 2015-11-18 MED ORDER — SODIUM CHLORIDE 0.9 % IV BOLUS (SEPSIS)
1000.0000 mL | Freq: Once | INTRAVENOUS | Status: AC
  Administered 2015-11-18: 1000 mL via INTRAVENOUS

## 2015-11-18 MED ORDER — ACETAMINOPHEN 325 MG PO TABS: 650.0000 mg | ORAL_TABLET | Freq: Four times a day (QID) | ORAL | Status: DC | PRN

## 2015-11-18 MED ORDER — METRONIDAZOLE IN NACL 5-0.79 MG/ML-% IV SOLN
500.0000 mg | Freq: Three times a day (TID) | INTRAVENOUS | Status: DC
  Administered 2015-11-18 – 2015-11-20 (×6): 500 mg via INTRAVENOUS
  Filled 2015-11-18 (×6): qty 100

## 2015-11-18 MED ORDER — CIPROFLOXACIN IN D5W 400 MG/200ML IV SOLN
400.0000 mg | Freq: Two times a day (BID) | INTRAVENOUS | Status: DC
  Administered 2015-11-19 – 2015-11-20 (×3): 400 mg via INTRAVENOUS
  Filled 2015-11-18 (×3): qty 200

## 2015-11-18 NOTE — ED Triage Notes (Signed)
PT c/o RUQ abdominal pain with nausea x5 days. PT denies any urinary symptoms and states normal BM yesterday.

## 2015-11-18 NOTE — ED Provider Notes (Signed)
[facility] Provider Note   CSN: [id number] Arrival date & time: 11/18/15  1034     History   Chief Complaint Chief Complaint  Patient presents with  . Abdominal Pain    HPI Sandra Brewer is a 25 y.o. female with past medical history significant for Cholelithiasis who presents with right upper quadrant abdominal pain, nausea, vomiting, and chills. Patient reports that prior to having her daughter last year, patient was diagnosed with gallstones however, she did not have her gallbladder removed at that time. Patient reports that for the last week, she has had a right upper quadrant abdominal pain. Patient has had ongoing nausea with onset of vomiting today. Patient describes the pain as 10 out of 10 in severity, constant, radiating around the side of her flank, not associated with exertion. Patient reports that it hurts her in her right upper quadrant to take a deep breath. Patient reports no association with constipation, diarrhea or dysuria. Patient denies vaginal bleeding or vaginal discharge. Patient says she had a normal bowel movement yesterday. Patient denies hematuria or change in urine amount. Patient says that she has had a bilateral salpingectomy. Patient denies taking any medications to help her symptoms.  The history is provided by the patient and the spouse. No language interpreter was used.  Abdominal Pain   This is a recurrent problem. The current episode started more than 2 days ago. The problem occurs constantly. The problem has been gradually worsening. The pain is located in the RUQ. The quality of the pain is dull. The pain is at a severity of 10/10. The pain is severe. Associated symptoms include nausea and vomiting. Pertinent negatives include fever, diarrhea, constipation, dysuria and frequency. The symptoms are aggravated by palpation and deep breathing. Nothing relieves the symptoms. Past workup includes ultrasound. Her past medical history is significant for  gallstones.    Past Medical History:  Diagnosis Date  . Gall stones     Patient Active Problem List   Diagnosis Date Noted  . Postop check 10/19/2015  . Encounter for sterilization bilateral salpingectomy 10/10/2015  . Consultation for female sterilization 09/20/2015  . Family planning services 08/22/2015  . Normal vaginal delivery 07/07/2014    Past Surgical History:  Procedure Laterality Date  . LAPAROSCOPIC BILATERAL SALPINGECTOMY Bilateral 10/10/2015   Procedure: LAPAROSCOPIC BILATERAL SALPINGECTOMY;  Surgeon: Tilda BurrowJohn V Ferguson, MD;  Location: [facility];  Service: Gynecology;  Laterality: Bilateral;  . NO PAST SURGERIES      OB History    Gravida Para Term Preterm AB Living   2 2 2     2    SAB TAB Ectopic Multiple Live Births         0 2       Home Medications    Prior to Admission medications   Medication Sig Start Date End Date Taking? Authorizing Provider  acetaminophen (TYLENOL) 500 MG tablet Take 500 mg by mouth every 6 (six) hours as needed for mild pain.   Yes Historical Provider, MD    Family History Family History  Problem Relation Age of Onset  . Hypertension Mother   . Hypertension Father   . Heart disease Father   . Heart attack Father     Social History Social History  Substance Use Topics  . Smoking status: Never Smoker  . Smokeless tobacco: Never Used  . Alcohol use No     Allergies   Review of patient's allergies indicates no known allergies.   Review of Systems Review  of Systems  Constitutional: Positive for chills. Negative for diaphoresis, fatigue and fever.  HENT: Negative for congestion and rhinorrhea.   Eyes: Negative for visual disturbance.  Respiratory: Negative for cough, chest tightness, shortness of breath, wheezing and stridor.   Cardiovascular: Negative for chest pain and palpitations.  Gastrointestinal: Positive for abdominal pain, nausea and vomiting. Negative for constipation and diarrhea.  Endocrine: Negative for  polyuria.  Genitourinary: Negative for decreased urine volume, dysuria, flank pain, frequency, pelvic pain, vaginal bleeding, vaginal discharge and vaginal pain.  Musculoskeletal: Negative for back pain, neck pain and neck stiffness.  Skin: Negative for wound.  Neurological: Negative for weakness.  Psychiatric/Behavioral: Negative for agitation and confusion.  All other systems reviewed and are negative.    Physical Exam Updated Vital Signs BP 145/89 (BP Location: Right Arm)   Pulse 65   Temp 98.2 F (36.8 C) (Oral)   Resp 18   Ht 5' (1.524 m)   Wt 157 lb (71.2 kg)   LMP 11/03/2015   SpO2 100%   BMI 30.66 kg/m   Physical Exam  Constitutional: She appears well-developed and well-nourished. No distress.  HENT:  Head: Normocephalic and atraumatic.  Mouth/Throat: Oropharynx is clear and moist.  Eyes: Conjunctivae are normal.  Neck: Neck supple.  Cardiovascular: Normal rate and regular rhythm.   No murmur heard. Pulmonary/Chest: Effort normal and breath sounds normal. No respiratory distress. She exhibits no tenderness.  Abdominal: Soft. Normal appearance. There is tenderness. There is positive Murphy's sign. There is no rigidity and no CVA tenderness.    Musculoskeletal: She exhibits no edema or tenderness.  Neurological: She is alert. She exhibits normal muscle tone.  Skin: Skin is warm and dry. No rash noted. She is not diaphoretic.  Psychiatric: She has a normal mood and affect.  Nursing note and vitals reviewed.    ED Treatments / Results  Labs (all labs ordered are listed, but only abnormal results are displayed) Labs Reviewed  COMPREHENSIVE METABOLIC PANEL - Abnormal; Notable for the following:       Result Value   Sodium 134 (*)    Chloride 99 (*)    Glucose, Bld 122 (*)    All other components within normal limits  CBC - Abnormal; Notable for the following:    WBC 10.6 (*)    All other components within normal limits  URINALYSIS, ROUTINE W REFLEX  MICROSCOPIC (NOT AT [facility]) - Abnormal; Notable for the following:    Protein, ur TRACE (*)    All other components within normal limits  URINE MICROSCOPIC-ADD ON - Abnormal; Notable for the following:    Squamous Epithelial / LPF 6-30 (*)    Bacteria, UA FEW (*)    All other components within normal limits  LIPASE, BLOOD  PREGNANCY, URINE  LACTIC ACID, PLASMA  PROTIME-INR  COMPREHENSIVE METABOLIC PANEL  CBC    EKG  EKG Interpretation None       Radiology US Abdomen Limited Ruq  Result Date: 11/18/2015 CLINICAL DATA:  Patient with nausea and right upper quadrant abdominal pain. EXAM: US ABDOMEN LIMITED - RIGHT UPPER QUADRANT COMPARISON:  Abdominal ultrasound 07/05/2014 FINDINGS: Gallbladder: Nonmobile stone demonstrated within the gallbladder neck. Gallbladder wall thickening measuring up to 10 mm. No significant pericholecystic fluid. Negative sonographic Murphy's sign. Common bile duct: Diameter: 5 mm Liver: Mildly increased in echogenicity.  No focal lesion identified. IMPRESSION: Nonmobile stone within the gallbladder neck with mild gallbladder wall thickening. Findings are indeterminate for acute cholecystitis. Consider correlation with HIDA scan  as clinically indicated. Mild hepatic steatosis. Electronically Signed   By: Annia Belt M.D.   On: 11/18/2015 13:38    Procedures Procedures (including critical care time)  Medications Ordered in ED Medications  0.9 %  sodium chloride infusion ( Intravenous New Bag/Given 11/18/15 1640)  acetaminophen (TYLENOL) tablet 650 mg (not administered)    Or  acetaminophen (TYLENOL) suppository 650 mg (not administered)  oxyCODONE (Oxy IR/ROXICODONE) immediate release tablet 5 mg (not administered)  ondansetron (ZOFRAN) tablet 4 mg (not administered)    Or  ondansetron (ZOFRAN) injection 4 mg (not administered)  morphine 2 MG/ML injection 2 mg (2 mg Intravenous Given 11/18/15 1652)  ciprofloxacin (CIPRO) IVPB 400 mg (not administered)    metroNIDAZOLE (FLAGYL) IVPB 500 mg (500 mg Intravenous Given 11/18/15 1657)  morphine 4 MG/ML injection 4 mg (4 mg Intravenous Given 11/18/15 1218)  ondansetron (ZOFRAN) injection 4 mg (4 mg Intravenous Given 11/18/15 1218)  sodium chloride 0.9 % bolus 1,000 mL (1,000 mLs Intravenous New Bag/Given 11/18/15 1219)  morphine 4 MG/ML injection 4 mg (4 mg Intravenous Given 11/18/15 1359)  ciprofloxacin (CIPRO) IVPB 400 mg (400 mg Intravenous New Bag/Given 11/18/15 1537)     Initial Impression / Assessment and Plan / ED Course  I have reviewed the triage vital signs and the nursing notes.  Pertinent labs & imaging results that were available during my care of the patient were reviewed by me and considered in my medical decision making (see chart for details).  Clinical Course    Katerina Zurn is a 25 y.o. female past medical history significant for cholelithiasis who presents with right upper quadrant abdominal pain. Based on patient's history and physical exam, primary concern for acute cholecystitis versus symptomatic cholelithiasis. Patient will have medications ordered for fluids, pain medication, and nausea medications. Patient will have laboratory testing to look for occult urinary tract infection or electrolyte abnormality. Patient will have right upper quadrant ultrasound to look for acute cholecystitis.  Laboratory testing revealed no evidence of infection in the urine, No evidence of hepatitis, and no evidence of pancreatitis as etiology of right upper quadrant abdominal pain. Lactic acid was nonelevated however, my leukocytosis was present. Right upper quadrant ultrasound showed concern for possible acute cholecystitis with wall thickening.  Given patient severe tenderness, ultrasound findings, nausea with vomiting, and mild leukocytosis, patient treated with Cipro at the direction of general surgery and will be admitted to hospitalist service to await further management of likely cholecystitis.  Patient reported improvement in symptoms following pain and nausea medications.  Patient admitted In stable condition.    Final Clinical Impressions(s) / ED Diagnoses   Final diagnoses:  RUQ pain    Clinical Impression: 1. RUQ pain     Disposition: Admit  Condition: Stable    Heide Scales, MD 11/18/15 2003

## 2015-11-18 NOTE — H&P (Signed)
History and Physical    Sandra Brewer WUJ:[id number] DOB: 08/23/1990 DOA: 11/18/2015  PCP: Tilda Burrow, MD  Patient coming from: Home  Chief Complaint: Abdominal Pain  HPI: Sandra Brewer is a 25 y.o. female with no snacking past medical history, presenting to the emergency department with complaints of abdominal pain located in the right upper quadrant. She reports having right upper quadrant and epigastric pain intermittently over the past year since delivery of her second child. This morning she woke up to intense abdominal pain that was associated with nausea, vomiting, chills. She denied hematemesis, bloody stools, melena. She reports being told she had gallstones last year. Still has her gallbladder.  ED Course: In the emergency department she had an abdominal ultrasound that revealed a nonmobile stone within the gallbladder neck associated with wall thickening. ED provider discussed case with Dr. Lovell Sheehan who was on-call with surgery, request medicine admit and he would consult.  Review of Systems: As per HPI otherwise 10 point review of systems negative.    Past Medical History:  Diagnosis Date  . Gall stones     Past Surgical History:  Procedure Laterality Date  . LAPAROSCOPIC BILATERAL SALPINGECTOMY Bilateral 10/10/2015   Procedure: LAPAROSCOPIC BILATERAL SALPINGECTOMY;  Surgeon: Tilda Burrow, MD;  Location: [facility];  Service: Gynecology;  Laterality: Bilateral;  . NO PAST SURGERIES       reports that she has never smoked. She has never used smokeless tobacco. She reports that she does not drink alcohol or use drugs.  No Known Allergies  Family History  Problem Relation Age of Onset  . Hypertension Mother   . Hypertension Father   . Heart disease Father   . Heart attack Father      Prior to Admission medications   Medication Sig Start Date End Date Taking? Authorizing Provider  acetaminophen (TYLENOL) 500 MG tablet Take 500 mg by mouth every 6 (six) hours as needed  for mild pain.   Yes Historical Provider, MD    Physical Exam: Vitals:   11/18/15 1047 11/18/15 1426  BP: 145/89 129/88  Pulse: 65 79  Resp: 18 17  Temp: 98.2 F (36.8 C)   TempSrc: Oral   SpO2: 100% 100%  Weight: 71.2 kg (157 lb)   Height: 5' (1.524 m)       Constitutional: NAD, calm, comfortable Vitals:   11/18/15 1047 11/18/15 1426  BP: 145/89 129/88  Pulse: 65 79  Resp: 18 17  Temp: 98.2 F (36.8 C)   TempSrc: Oral   SpO2: 100% 100%  Weight: 71.2 kg (157 lb)   Height: 5' (1.524 m)    Eyes: PERRL, lids and conjunctivae normal ENMT: Mucous membranes are moist. Posterior pharynx clear of any exudate or lesions.Normal dentition.  Neck: normal, supple, no masses, no thyromegaly Respiratory: clear to auscultation bilaterally, no wheezing, no crackles. Normal respiratory effort. No accessory muscle use.  Cardiovascular: Regular rate and rhythm, no murmurs / rubs / gallops. No extremity edema. 2+ pedal pulses. No carotid bruits.  Abdomen: no tenderness, no masses palpated. No hepatosplenomegaly. Bowel sounds positive.  Musculoskeletal: There is pain to palpation over right upper quadrant with a positive Murphy sign. Skin: no rashes, lesions, ulcers. No induration Neurologic: CN 2-12 grossly intact. Sensation intact, DTR normal. Strength 5/5 in all 4.  Psychiatric: Normal judgment and insight. Alert and oriented x 3. Normal mood.     Labs on Admission: I have personally reviewed following labs and imaging studies  CBC:  Recent Labs Lab 11/18/15  1057  WBC 10.6*  HGB 12.5  HCT 37.4  MCV 91.7  PLT 370   Basic Metabolic Panel:  Recent Labs Lab 11/18/15 1057  NA 134*  K 4.1  CL 99*  CO2 25  GLUCOSE 122*  BUN 14  CREATININE 0.57  CALCIUM 9.2   GFR: Estimated Creatinine Clearance: 93 mL/min (by C-G formula based on SCr of 0.57 mg/dL). Liver Function Tests:  Recent Labs Lab 11/18/15 1057  AST 18  ALT 15  ALKPHOS 62  BILITOT 0.3  PROT 8.1  ALBUMIN  4.6    Recent Labs Lab 11/18/15 1057  LIPASE 16   No results for input(s): AMMONIA in the last 168 hours. Coagulation Profile: No results for input(s): INR, PROTIME in the last 168 hours. Cardiac Enzymes: No results for input(s): CKTOTAL, CKMB, CKMBINDEX, TROPONINI in the last 168 hours. BNP (last 3 results) No results for input(s): PROBNP in the last 8760 hours. HbA1C: No results for input(s): HGBA1C in the last 72 hours. CBG: No results for input(s): GLUCAP in the last 168 hours. Lipid Profile: No results for input(s): CHOL, HDL, LDLCALC, TRIG, CHOLHDL, LDLDIRECT in the last 72 hours. Thyroid Function Tests: No results for input(s): TSH, T4TOTAL, FREET4, T3FREE, THYROIDAB in the last 72 hours. Anemia Panel: No results for input(s): VITAMINB12, FOLATE, FERRITIN, TIBC, IRON, RETICCTPCT in the last 72 hours. Urine analysis:    Component Value Date/Time   COLORURINE YELLOW 11/18/2015 1050   APPEARANCEUR CLEAR 11/18/2015 1050   LABSPEC 1.025 11/18/2015 1050   PHURINE 6.5 11/18/2015 1050   GLUCOSEU NEGATIVE 11/18/2015 1050   HGBUR NEGATIVE 11/18/2015 1050   BILIRUBINUR NEGATIVE 11/18/2015 1050   KETONESUR NEGATIVE 11/18/2015 1050   PROTEINUR TRACE (A) 11/18/2015 1050   NITRITE NEGATIVE 11/18/2015 1050   LEUKOCYTESUR NEGATIVE 11/18/2015 1050   Sepsis Labs: !!!!!!!!!!!!!!!!!!!!!!!!!!!!!!!!!!!!!!!!!!!! @LABRCNTIP (procalcitonin:4,lacticidven:4) )No results found for this or any previous visit (from the past 240 hour(s)).   Radiological Exams on Admission: [place] Abdomen Limited Ruq  Result Date: 11/18/2015 CLINICAL DATA:  Patient with nausea and right upper quadrant abdominal pain. EXAM: US ABDOMEN LIMITED - RIGHT UPPER QUADRANT COMPARISON:  Abdominal ultrasound 07/05/2014 FINDINGS: Gallbladder: Nonmobile stone demonstrated within the gallbladder neck. Gallbladder wall thickening measuring up to 10 mm. No significant pericholecystic fluid. Negative sonographic Murphy's sign. Common  bile duct: Diameter: 5 mm Liver: Mildly increased in echogenicity.  No focal lesion identified. IMPRESSION: Nonmobile stone within the gallbladder neck with mild gallbladder wall thickening. Findings are indeterminate for acute cholecystitis. Consider correlation with HIDA scan as clinically indicated. Mild hepatic steatosis. Electronically Signed   By: Annia Beltrew  Davis M.D.   On: 11/18/2015 13:38    EKG: Independently reviewed.   Assessment/Plan Principal Problem:   Acute cholecystitis   1.  Acute cholecystitis. The Sandra CatholicSalas is a 25 year old female with gastric and past medical history presenting with severe right upper quadrant pain. States having intermittent abdominal pain over the past year since delivery of her second child. On physical examination had positive Murphy sign. Abdominal ultrasound showing non-mobile stone within the gallbladder neck associate with wall thickening. Clinical picture consistent with acute cholecystitis. General surgery consulted. Will keep her nothing by mouth, provide empiric IV antibody therapy with ciprofloxacin and Flagyl. Await further recommendations from general surgery   DVT prophylaxis: SCD's Code Status: Full code Family Communication: Spoke with her husband Disposition Plan: Admit to MedSurg Consults called: Dr. Lovell SheehanJenkins general surgery consulted by emergency room provider Admission status: Inpatient   Jeralyn BennettZAMORA, Sandra Sneed MD [facility] Pager  [phone]  If 7PM-7AM, please contact night-coverage [email or web address] Password [email or web address]  11/18/2015, 2:56 PM

## 2015-11-19 LAB — CBC
HCT: 35.4 % — ABNORMAL LOW (ref 36.0–46.0)
HEMOGLOBIN: 11.9 g/dL — AB (ref 12.0–15.0)
MCH: 31 pg (ref 26.0–34.0)
MCHC: 33.6 g/dL (ref 30.0–36.0)
MCV: 92.2 fL (ref 78.0–100.0)
Platelets: 361 10*3/uL (ref 150–400)
RBC: 3.84 MIL/uL — ABNORMAL LOW (ref 3.87–5.11)
RDW: 12.1 % (ref 11.5–15.5)
WBC: 10 10*3/uL (ref 4.0–10.5)

## 2015-11-19 LAB — COMPREHENSIVE METABOLIC PANEL
ALBUMIN: 4 g/dL (ref 3.5–5.0)
ALK PHOS: 57 U/L (ref 38–126)
ALT: 14 U/L (ref 14–54)
ANION GAP: 6 (ref 5–15)
AST: 15 U/L (ref 15–41)
BILIRUBIN TOTAL: 0.5 mg/dL (ref 0.3–1.2)
BUN: 7 mg/dL (ref 6–20)
CALCIUM: 8.5 mg/dL — AB (ref 8.9–10.3)
CO2: 26 mmol/L (ref 22–32)
Chloride: 102 mmol/L (ref 101–111)
Creatinine, Ser: 0.6 mg/dL (ref 0.44–1.00)
GLUCOSE: 145 mg/dL — AB (ref 65–99)
POTASSIUM: 3.7 mmol/L (ref 3.5–5.1)
Sodium: 134 mmol/L — ABNORMAL LOW (ref 135–145)
TOTAL PROTEIN: 7.2 g/dL (ref 6.5–8.1)

## 2015-11-19 LAB — SURGICAL PCR SCREEN
MRSA, PCR: NEGATIVE
STAPHYLOCOCCUS AUREUS: NEGATIVE

## 2015-11-19 MED ORDER — ENOXAPARIN SODIUM 40 MG/0.4ML ~~LOC~~ SOLN
40.0000 mg | Freq: Once | SUBCUTANEOUS | Status: AC
  Administered 2015-11-20: 40 mg via SUBCUTANEOUS
  Filled 2015-11-19: qty 0.4

## 2015-11-19 MED ORDER — KCL IN DEXTROSE-NACL 20-5-0.45 MEQ/L-%-% IV SOLN
INTRAVENOUS | Status: DC
  Administered 2015-11-19 – 2015-11-20 (×2): via INTRAVENOUS

## 2015-11-19 MED ORDER — CHLORHEXIDINE GLUCONATE CLOTH 2 % EX PADS: 6.0000 | MEDICATED_PAD | Freq: Once | CUTANEOUS | Status: DC

## 2015-11-19 MED ORDER — CHLORHEXIDINE GLUCONATE CLOTH 2 % EX PADS
6.0000 | MEDICATED_PAD | Freq: Once | CUTANEOUS | Status: AC
  Administered 2015-11-19: 6 via TOPICAL

## 2015-11-19 NOTE — Progress Notes (Signed)
PROGRESS NOTE    Myriam JacobsonDonna Zulauf  ZOX:[id number] DOB: 01/11/1991 DOA: 11/18/2015 PCP: Tilda BurrowFERGUSON,JOHN V, MD   Brief Narrative:  Admitted on 11/18/2015 presenting with right upper quadrant abdominal pain, ultrasound showing nonmobile stone within the neck of gallbladder associate with wall thickening. Gen. surgery consulted  Assessment & Plan:   Principal Problem:   Acute cholecystitis   1.  Acute cholecystitis -She has had intermittent right upper quadrant pain over the past year, becoming acutely worse in the last 24 hours. -On exam had a positive Murphy sign -Ultrasound showed presence of gallbladder stone with associated gallbladder wall thickening -Gen. surgery has been consulted, will likely take her to the operating room tomorrow. -I spoke with Dr. Lovell SheehanJenkins who will take over as primary for Mrs Rennis Hardingllis.    DVT prophylaxis: SCD's Code Status: Full Code Family Communication: I spoke with her husband Disposition Plan: Surgery in am  Consultants:   General surgery  Antimicrobials:   Ciprofloxacin  Flagyl   Subjective: She reports having a unremarkable night, pain control denies nausea  Objective: Vitals:   11/18/15 1530 11/18/15 1605 11/18/15 2016 11/19/15 0536  BP: 144/82 (!) 141/81 132/88 128/82  Pulse: 65 74 78 88  Resp:  16 18 18   Temp:  98.7 F (37.1 C) 98.3 F (36.8 C) 99.5 F (37.5 C)  TempSrc:  Oral Oral Oral  SpO2: 98% 100% 100% 99%  Weight:  72.3 kg (159 lb 8 oz)    Height:  5' (1.524 m)      Intake/Output Summary (Last 24 hours) at 11/19/15 1048 Last data filed at 11/19/15 0500  Gross per 24 hour  Intake          1633.33 ml  Output              400 ml  Net          1233.33 ml   Filed Weights   11/18/15 1047 11/18/15 1605  Weight: 71.2 kg (157 lb) 72.3 kg (159 lb 8 oz)    Examination:  General exam: Appears calm and comfortable  Respiratory system: Clear to auscultation. Respiratory effort normal. Cardiovascular system: S1 & S2 heard, RRR.  No JVD, murmurs, rubs, gallops or clicks. No pedal edema. Gastrointestinal system: There is mild pain to palpation over right upper quadrant region Central nervous system: Alert and oriented. No focal neurological deficits. Extremities: Symmetric 5 x 5 power. Skin: No rashes, lesions or ulcers Psychiatry: Judgement and insight appear normal. Mood & affect appropriate.     Data Reviewed: I have personally reviewed following labs and imaging studies  CBC:  Recent Labs Lab 11/18/15 1057 11/19/15 0556  WBC 10.6* 10.0  HGB 12.5 11.9*  HCT 37.4 35.4*  MCV 91.7 92.2  PLT 370 361   Basic Metabolic Panel:  Recent Labs Lab 11/18/15 1057 11/19/15 0556  NA 134* 134*  K 4.1 3.7  CL 99* 102  CO2 25 26  GLUCOSE 122* 145*  BUN 14 7  CREATININE 0.57 0.60  CALCIUM 9.2 8.5*   GFR: Estimated Creatinine Clearance: 93.7 mL/min (by C-G formula based on SCr of 0.6 mg/dL). Liver Function Tests:  Recent Labs Lab 11/18/15 1057 11/19/15 0556  AST 18 15  ALT 15 14  ALKPHOS 62 57  BILITOT 0.3 0.5  PROT 8.1 7.2  ALBUMIN 4.6 4.0    Recent Labs Lab 11/18/15 1057  LIPASE 16   No results for input(s): AMMONIA in the last 168 hours. Coagulation Profile:  Recent Labs Lab 11/18/15  1102  INR 0.95   Cardiac Enzymes: No results for input(s): CKTOTAL, CKMB, CKMBINDEX, TROPONINI in the last 168 hours. BNP (last 3 results) No results for input(s): PROBNP in the last 8760 hours. HbA1C: No results for input(s): HGBA1C in the last 72 hours. CBG: No results for input(s): GLUCAP in the last 168 hours. Lipid Profile: No results for input(s): CHOL, HDL, LDLCALC, TRIG, CHOLHDL, LDLDIRECT in the last 72 hours. Thyroid Function Tests: No results for input(s): TSH, T4TOTAL, FREET4, T3FREE, THYROIDAB in the last 72 hours. Anemia Panel: No results for input(s): VITAMINB12, FOLATE, FERRITIN, TIBC, IRON, RETICCTPCT in the last 72 hours. Sepsis Labs:  Recent Labs Lab 11/18/15 1123    LATICACIDVEN 1.4    No results found for this or any previous visit (from the past 240 hour(s)).       Radiology Studies: US Abdomen Limited Ruq  Result Date: 11/18/2015 CLINICAL DATA:  Patient with nausea and right upper quadrant abdominal pain. EXAM: US ABDOMEN LIMITED - RIGHT UPPER QUADRANT COMPARISON:  Abdominal ultrasound 07/05/2014 FINDINGS: Gallbladder: Nonmobile stone demonstrated within the gallbladder neck. Gallbladder wall thickening measuring up to 10 mm. No significant pericholecystic fluid. Negative sonographic Murphy's sign. Common bile duct: Diameter: 5 mm Liver: Mildly increased in echogenicity.  No focal lesion identified. IMPRESSION: Nonmobile stone within the gallbladder neck with mild gallbladder wall thickening. Findings are indeterminate for acute cholecystitis. Consider correlation with HIDA scan as clinically indicated. Mild hepatic steatosis. Electronically Signed   By: Annia Belt M.D.   On: 11/18/2015 13:38        Scheduled Meds: . ciprofloxacin  400 mg Intravenous Q12H  . metronidazole  500 mg Intravenous Q8H   Continuous Infusions: . dextrose 5 % and 0.45 % NaCl with KCl 20 mEq/L 75 mL/hr at 11/19/15 1032     LOS: 1 day    Time spent: 25 min    Jeralyn Bennett, MD [facility] Pager [phone]  If 7PM-7AM, please contact night-coverage [email or web address] Password [facility] 11/19/2015, 10:48 AM

## 2015-11-19 NOTE — Consult Note (Signed)
Reason for Consult: Right upper quadrant abdominal pain Referring Physician: Dr. Jacques Earthly is a 25 y.o. female.  HPI: Patient is a 25 year old white female who presented to the emergency room with worsening right upper quadrant abdominal pain that started several days ago. She states she has a known history of gallstones, diagnosed around the time of her delivery of her second child last year. She states she has had intermittent episodes of nausea, right upper quadrant abdominal pain, fatty food intolerance over the past year. The pain worsened during this episode and that's why the patient came to the emergency room. The pain radiates around to the right side. She was admitted to the hospital for further evaluation treatment. Ultrasound of gallbladder revealed cholelithiasis. Liver enzyme tests were within normal limits. No fever, chills, or jaundice have been noted. The patient states her pain has somewhat eased with pain medication since admission.  Past Medical History:  Diagnosis Date  . Gall stones     Past Surgical History:  Procedure Laterality Date  . LAPAROSCOPIC BILATERAL SALPINGECTOMY Bilateral 10/10/2015   Procedure: LAPAROSCOPIC BILATERAL SALPINGECTOMY;  Surgeon: Jonnie Kind, MD;  Location: [facility];  Service: Gynecology;  Laterality: Bilateral;  . NO PAST SURGERIES      Family History  Problem Relation Age of Onset  . Hypertension Mother   . Hypertension Father   . Heart disease Father   . Heart attack Father     Social History:  reports that she has never smoked. She has never used smokeless tobacco. She reports that she does not drink alcohol or use drugs.  Allergies: No Known Allergies  Medications:  Scheduled: . ciprofloxacin  400 mg Intravenous Q12H  . metronidazole  500 mg Intravenous Q8H   Continuous: . dextrose 5 % and 0.45 % NaCl with KCl 20 mEq/L      Results for orders placed or performed during the hospital encounter of 11/18/15 (from the  past 48 hour(s))  Urinalysis, Routine w reflex microscopic     Status: Abnormal   Collection Time: 11/18/15 10:50 AM  Result Value Ref Range   Color, Urine YELLOW YELLOW   APPearance CLEAR CLEAR   Specific Gravity, Urine 1.025 1.005 - 1.030   pH 6.5 5.0 - 8.0   Glucose, UA NEGATIVE NEGATIVE mg/dL   Hgb urine dipstick NEGATIVE NEGATIVE   Bilirubin Urine NEGATIVE NEGATIVE   Ketones, ur NEGATIVE NEGATIVE mg/dL   Protein, ur TRACE (A) NEGATIVE mg/dL   Nitrite NEGATIVE NEGATIVE   Leukocytes, UA NEGATIVE NEGATIVE  Pregnancy, urine     Status: None   Collection Time: 11/18/15 10:50 AM  Result Value Ref Range   Preg Test, Ur NEGATIVE NEGATIVE    Comment:        THE SENSITIVITY OF THIS METHODOLOGY IS >20 mIU/mL.   Urine microscopic-add on     Status: Abnormal   Collection Time: 11/18/15 10:50 AM  Result Value Ref Range   Squamous Epithelial / LPF 6-30 (A) NONE SEEN   WBC, UA 0-5 0 - 5 WBC/hpf   RBC / HPF 0-5 0 - 5 RBC/hpf   Bacteria, UA FEW (A) NONE SEEN  Lipase, blood     Status: None   Collection Time: 11/18/15 10:57 AM  Result Value Ref Range   Lipase 16 11 - 51 U/L  Comprehensive metabolic panel     Status: Abnormal   Collection Time: 11/18/15 10:57 AM  Result Value Ref Range   Sodium 134 (L) 135 -  145 mmol/L   Potassium 4.1 3.5 - 5.1 mmol/L   Chloride 99 (L) 101 - 111 mmol/L   CO2 25 22 - 32 mmol/L   Glucose, Bld 122 (H) 65 - 99 mg/dL   BUN 14 6 - 20 mg/dL   Creatinine, Ser 0.57 0.44 - 1.00 mg/dL   Calcium 9.2 8.9 - 10.3 mg/dL   Total Protein 8.1 6.5 - 8.1 g/dL   Albumin 4.6 3.5 - 5.0 g/dL   AST 18 15 - 41 U/L   ALT 15 14 - 54 U/L   Alkaline Phosphatase 62 38 - 126 U/L   Total Bilirubin 0.3 0.3 - 1.2 mg/dL   GFR calc non Af Amer >60 >60 mL/min   GFR calc Af Amer >60 >60 mL/min    Comment: (NOTE) The eGFR has been calculated using the CKD EPI equation. This calculation has not been validated in all clinical situations. eGFR's persistently <60 mL/min signify possible  Chronic Kidney Disease.    Anion gap 10 5 - 15  CBC     Status: Abnormal   Collection Time: 11/18/15 10:57 AM  Result Value Ref Range   WBC 10.6 (H) 4.0 - 10.5 K/uL   RBC 4.08 3.87 - 5.11 MIL/uL   Hemoglobin 12.5 12.0 - 15.0 g/dL   HCT 37.4 36.0 - 46.0 %   MCV 91.7 78.0 - 100.0 fL   MCH 30.6 26.0 - 34.0 pg   MCHC 33.4 30.0 - 36.0 g/dL   RDW 11.6 11.5 - 15.5 %   Platelets 370 150 - 400 K/uL  Protime-INR     Status: None   Collection Time: 11/18/15 11:02 AM  Result Value Ref Range   Prothrombin Time 12.7 11.4 - 15.2 seconds   INR 0.95   Lactic acid, plasma     Status: None   Collection Time: 11/18/15 11:23 AM  Result Value Ref Range   Lactic Acid, Venous 1.4 0.5 - 1.9 mmol/L  Comprehensive metabolic panel     Status: Abnormal   Collection Time: 11/19/15  5:56 AM  Result Value Ref Range   Sodium 134 (L) 135 - 145 mmol/L   Potassium 3.7 3.5 - 5.1 mmol/L   Chloride 102 101 - 111 mmol/L   CO2 26 22 - 32 mmol/L   Glucose, Bld 145 (H) 65 - 99 mg/dL   BUN 7 6 - 20 mg/dL   Creatinine, Ser 0.60 0.44 - 1.00 mg/dL   Calcium 8.5 (L) 8.9 - 10.3 mg/dL   Total Protein 7.2 6.5 - 8.1 g/dL   Albumin 4.0 3.5 - 5.0 g/dL   AST 15 15 - 41 U/L   ALT 14 14 - 54 U/L   Alkaline Phosphatase 57 38 - 126 U/L   Total Bilirubin 0.5 0.3 - 1.2 mg/dL   GFR calc non Af Amer >60 >60 mL/min   GFR calc Af Amer >60 >60 mL/min    Comment: (NOTE) The eGFR has been calculated using the CKD EPI equation. This calculation has not been validated in all clinical situations. eGFR's persistently <60 mL/min signify possible Chronic Kidney Disease.    Anion gap 6 5 - 15  CBC     Status: Abnormal   Collection Time: 11/19/15  5:56 AM  Result Value Ref Range   WBC 10.0 4.0 - 10.5 K/uL   RBC 3.84 (L) 3.87 - 5.11 MIL/uL   Hemoglobin 11.9 (L) 12.0 - 15.0 g/dL   HCT 35.4 (L) 36.0 - 46.0 %   MCV 92.2  78.0 - 100.0 fL   MCH 31.0 26.0 - 34.0 pg   MCHC 33.6 30.0 - 36.0 g/dL   RDW 12.1 11.5 - 15.5 %   Platelets 361 150  - 400 K/uL    US Abdomen Limited Ruq  Result Date: 11/18/2015 CLINICAL DATA:  Patient with nausea and right upper quadrant abdominal pain. EXAM: US ABDOMEN LIMITED - RIGHT UPPER QUADRANT COMPARISON:  Abdominal ultrasound 07/05/2014 FINDINGS: Gallbladder: Nonmobile stone demonstrated within the gallbladder neck. Gallbladder wall thickening measuring up to 10 mm. No significant pericholecystic fluid. Negative sonographic Murphy's sign. Common bile duct: Diameter: 5 mm Liver: Mildly increased in echogenicity.  No focal lesion identified. IMPRESSION: Nonmobile stone within the gallbladder neck with mild gallbladder wall thickening. Findings are indeterminate for acute cholecystitis. Consider correlation with HIDA scan as clinically indicated. Mild hepatic steatosis. Electronically Signed   By: Lovey Newcomer M.D.   On: 11/18/2015 13:38    ROS:  Pertinent items noted in HPI and remainder of comprehensive ROS otherwise negative.  Blood pressure 128/82, pulse 88, temperature 99.5 F (37.5 C), temperature source Oral, resp. rate 18, height 5' (1.524 m), weight 72.3 kg (159 lb 8 oz), last menstrual period 11/03/2015, SpO2 99 %, not currently breastfeeding. Physical Exam: Pleasant well-developed and well-nourished white female in no acute distress. Head examination is normocephalic, atraumatic. Eyes are without scleral icterus. Neck is supple without lymphadenopathy. Lungs clear to auscultation with equal breath sounds bilaterally. Heart examination reveals a regular rate and rhythm without S3, S4, murmurs. The abdomen is soft with mild tenderness to palpation in the right upper quadrant. No hepatosplenomegaly or masses noted. A small umbilical hernia is present. No rigidity is noted.  Assessment/Plan: Impression: Acute cholecystitis, cholelithiasis Plan: Patient be taken to the operating room for laparoscopic cholecystectomy tomorrow. The risks and benefits of the procedure including bleeding, infection,  hepatobiliary injury, the possibility of an open procedure were fully explained to the patient, who gave informed consent.  Tillmon Kisling A 11/19/2015, 8:54 AM

## 2015-11-20 ENCOUNTER — Inpatient Hospital Stay (HOSPITAL_COMMUNITY): Payer: Medicaid Other | Admitting: Anesthesiology

## 2015-11-20 ENCOUNTER — Encounter (HOSPITAL_COMMUNITY)
Admission: EM | Disposition: A | Discharge status: Home / Self Care | Payer: Self-pay | Source: Home / Self Care | Attending: General Surgery

## 2015-11-20 ENCOUNTER — Encounter (HOSPITAL_COMMUNITY): Payer: Self-pay | Admitting: Certified Registered Nurse Anesthetist

## 2015-11-20 HISTORY — PX: CHOLECYSTECTOMY: SHX55

## 2015-11-20 SURGERY — LAPAROSCOPIC CHOLECYSTECTOMY
Anesthesia: General | Site: Abdomen

## 2015-11-20 MED ORDER — DIPHENHYDRAMINE HCL 25 MG PO CAPS: 25.0000 mg | ORAL_CAPSULE | Freq: Four times a day (QID) | ORAL | Status: DC | PRN

## 2015-11-20 MED ORDER — LACTATED RINGERS IV SOLN: INTRAVENOUS | Status: DC

## 2015-11-20 MED ORDER — FENTANYL CITRATE (PF) 100 MCG/2ML IJ SOLN
INTRAMUSCULAR | Status: AC
  Filled 2015-11-20: qty 2

## 2015-11-20 MED ORDER — SODIUM CHLORIDE 0.9 % IR SOLN
Status: DC | PRN
  Administered 2015-11-20: 1000 mL

## 2015-11-20 MED ORDER — DIPHENHYDRAMINE HCL 50 MG/ML IJ SOLN: 25.0000 mg | Freq: Four times a day (QID) | INTRAMUSCULAR | Status: DC | PRN

## 2015-11-20 MED ORDER — BUPIVACAINE HCL (PF) 0.5 % IJ SOLN
INTRAMUSCULAR | Status: AC
Start: 2015-11-20 — End: 2015-11-20
  Filled 2015-11-20: qty 30

## 2015-11-20 MED ORDER — NEOSTIGMINE METHYLSULFATE 10 MG/10ML IV SOLN
INTRAVENOUS | Status: DC | PRN
  Administered 2015-11-20: 4 mg via INTRAVENOUS

## 2015-11-20 MED ORDER — FENTANYL CITRATE (PF) 250 MCG/5ML IJ SOLN
INTRAMUSCULAR | Status: AC
  Filled 2015-11-20: qty 5

## 2015-11-20 MED ORDER — ENOXAPARIN SODIUM 40 MG/0.4ML ~~LOC~~ SOLN: 40.0000 mg | SUBCUTANEOUS | Status: DC

## 2015-11-20 MED ORDER — ROCURONIUM BROMIDE 50 MG/5ML IV SOLN
INTRAVENOUS | Status: AC
  Filled 2015-11-20: qty 1

## 2015-11-20 MED ORDER — OXYCODONE-ACETAMINOPHEN 7.5-325 MG PO TABS: 1.0000 | ORAL_TABLET | ORAL | 0 refills | Status: DC | PRN

## 2015-11-20 MED ORDER — POVIDONE-IODINE 10 % EX OINT
TOPICAL_OINTMENT | CUTANEOUS | Status: AC
  Filled 2015-11-20: qty 1

## 2015-11-20 MED ORDER — LIDOCAINE HCL (PF) 1 % IJ SOLN
INTRAMUSCULAR | Status: AC
  Filled 2015-11-20: qty 5

## 2015-11-20 MED ORDER — KETOROLAC TROMETHAMINE 30 MG/ML IJ SOLN
INTRAMUSCULAR | Status: AC
  Filled 2015-11-20: qty 1

## 2015-11-20 MED ORDER — POVIDONE-IODINE 10 % OINT PACKET
TOPICAL_OINTMENT | CUTANEOUS | Status: DC | PRN
  Administered 2015-11-20: 1 via TOPICAL

## 2015-11-20 MED ORDER — PROPOFOL 10 MG/ML IV BOLUS
INTRAVENOUS | Status: AC
  Filled 2015-11-20: qty 20

## 2015-11-20 MED ORDER — MIDAZOLAM HCL 2 MG/2ML IJ SOLN
INTRAMUSCULAR | Status: AC
  Filled 2015-11-20: qty 2

## 2015-11-20 MED ORDER — LORAZEPAM 2 MG/ML IJ SOLN: 1.0000 mg | INTRAMUSCULAR | Status: DC | PRN

## 2015-11-20 MED ORDER — ONDANSETRON HCL 4 MG/2ML IJ SOLN
4.0000 mg | Freq: Once | INTRAMUSCULAR | Status: AC
  Administered 2015-11-20: 4 mg via INTRAVENOUS

## 2015-11-20 MED ORDER — GLYCOPYRROLATE 0.2 MG/ML IJ SOLN
INTRAMUSCULAR | Status: AC
  Filled 2015-11-20: qty 3

## 2015-11-20 MED ORDER — GLYCOPYRROLATE 0.2 MG/ML IJ SOLN
INTRAMUSCULAR | Status: DC | PRN
  Administered 2015-11-20: 0.6 mg via INTRAVENOUS

## 2015-11-20 MED ORDER — ROCURONIUM BROMIDE 100 MG/10ML IV SOLN
INTRAVENOUS | Status: DC | PRN
  Administered 2015-11-20: 30 mg via INTRAVENOUS

## 2015-11-20 MED ORDER — LACTATED RINGERS IV SOLN
INTRAVENOUS | Status: DC
  Administered 2015-11-20 (×2): via INTRAVENOUS

## 2015-11-20 MED ORDER — MIDAZOLAM HCL 2 MG/2ML IJ SOLN
1.0000 mg | INTRAMUSCULAR | Status: DC | PRN
  Administered 2015-11-20: 2 mg via INTRAVENOUS

## 2015-11-20 MED ORDER — FENTANYL CITRATE (PF) 100 MCG/2ML IJ SOLN
INTRAMUSCULAR | Status: DC | PRN
  Administered 2015-11-20 (×2): 50 ug via INTRAVENOUS
  Administered 2015-11-20: 100 ug via INTRAVENOUS
  Administered 2015-11-20 (×3): 50 ug via INTRAVENOUS

## 2015-11-20 MED ORDER — KETOROLAC TROMETHAMINE 30 MG/ML IJ SOLN
30.0000 mg | Freq: Once | INTRAMUSCULAR | Status: AC
  Administered 2015-11-20: 30 mg via INTRAVENOUS

## 2015-11-20 MED ORDER — DEXAMETHASONE SODIUM PHOSPHATE 4 MG/ML IJ SOLN
4.0000 mg | Freq: Once | INTRAMUSCULAR | Status: AC
  Administered 2015-11-20: 4 mg via INTRAVENOUS

## 2015-11-20 MED ORDER — DEXAMETHASONE SODIUM PHOSPHATE 4 MG/ML IJ SOLN
INTRAMUSCULAR | Status: AC
  Filled 2015-11-20: qty 1

## 2015-11-20 MED ORDER — LIDOCAINE HCL (CARDIAC) 10 MG/ML IV SOLN
INTRAVENOUS | Status: DC | PRN
  Administered 2015-11-20: 50 mg via INTRAVENOUS

## 2015-11-20 MED ORDER — HEMOSTATIC AGENTS (NO CHARGE) OPTIME
TOPICAL | Status: DC | PRN
  Administered 2015-11-20: 1 via TOPICAL

## 2015-11-20 MED ORDER — BUPIVACAINE HCL (PF) 0.5 % IJ SOLN
INTRAMUSCULAR | Status: DC | PRN
  Administered 2015-11-20: 10 mL

## 2015-11-20 MED ORDER — SIMETHICONE 80 MG PO CHEW: 40.0000 mg | CHEWABLE_TABLET | Freq: Four times a day (QID) | ORAL | Status: DC | PRN

## 2015-11-20 MED ORDER — HYDROMORPHONE HCL 1 MG/ML IJ SOLN: 1.0000 mg | INTRAMUSCULAR | Status: DC | PRN

## 2015-11-20 SURGICAL SUPPLY — 40 items
APPLIER CLIP LAPSCP 10X32 DD (CLIP) ×3 IMPLANT
BAG HAMPER (MISCELLANEOUS) ×3 IMPLANT
CHLORAPREP W/TINT 26ML (MISCELLANEOUS) ×3 IMPLANT
CLOTH BEACON ORANGE TIMEOUT ST (SAFETY) ×3 IMPLANT
COVER LIGHT HANDLE STERIS (MISCELLANEOUS) ×6 IMPLANT
DECANTER SPIKE VIAL GLASS SM (MISCELLANEOUS) ×3 IMPLANT
ELECT REM PT RETURN 9FT ADLT (ELECTROSURGICAL) ×3
ELECTRODE REM PT RTRN 9FT ADLT (ELECTROSURGICAL) ×1 IMPLANT
FILTER SMOKE EVAC LAPAROSHD (FILTER) ×3 IMPLANT
FORMALIN 10 PREFIL 120ML (MISCELLANEOUS) ×3 IMPLANT
GLOVE BIOGEL PI IND STRL 7.0 (GLOVE) ×1 IMPLANT
GLOVE BIOGEL PI INDICATOR 7.0 (GLOVE) ×2
GLOVE SURG SS PI 7.5 STRL IVOR (GLOVE) ×3 IMPLANT
GOWN STRL REUS W/ TWL XL LVL3 (GOWN DISPOSABLE) ×1 IMPLANT
GOWN STRL REUS W/TWL LRG LVL3 (GOWN DISPOSABLE) ×6 IMPLANT
GOWN STRL REUS W/TWL XL LVL3 (GOWN DISPOSABLE) ×2
HEMOSTAT SNOW SURGICEL 2X4 (HEMOSTASIS) ×6 IMPLANT
INST SET LAPROSCOPIC AP (KITS) ×3 IMPLANT
KIT ROOM TURNOVER APOR (KITS) ×3 IMPLANT
MANIFOLD NEPTUNE II (INSTRUMENTS) ×3 IMPLANT
NEEDLE INSUFFLATION 14GA 120MM (NEEDLE) ×3 IMPLANT
NS IRRIG 1000ML POUR BTL (IV SOLUTION) ×3 IMPLANT
PACK LAP CHOLE LZT030E (CUSTOM PROCEDURE TRAY) ×3 IMPLANT
PAD ARMBOARD 7.5X6 YLW CONV (MISCELLANEOUS) ×3 IMPLANT
POUCH SPECIMEN RETRIEVAL 10MM (ENDOMECHANICALS) ×3 IMPLANT
SET BASIN LINEN APH (SET/KITS/TRAYS/PACK) ×3 IMPLANT
SET TUBE IRRIG SUCTION NO TIP (IRRIGATION / IRRIGATOR) IMPLANT
SLEEVE ENDOPATH XCEL 5M (ENDOMECHANICALS) ×3 IMPLANT
SPONGE GAUZE 2X2 8PLY STER LF (GAUZE/BANDAGES/DRESSINGS) ×1
SPONGE GAUZE 2X2 8PLY STRL LF (GAUZE/BANDAGES/DRESSINGS) ×2 IMPLANT
STAPLER VISISTAT (STAPLE) ×3 IMPLANT
SUT VICRYL 0 UR6 27IN ABS (SUTURE) ×3 IMPLANT
TAPE CLOTH SURG 4X10 WHT LF (GAUZE/BANDAGES/DRESSINGS) ×3 IMPLANT
TROCAR ENDO BLADELESS 11MM (ENDOMECHANICALS) ×3 IMPLANT
TROCAR XCEL NON-BLD 5MMX100MML (ENDOMECHANICALS) ×3 IMPLANT
TROCAR XCEL UNIV SLVE 11M 100M (ENDOMECHANICALS) ×3 IMPLANT
TUBE CONNECTING 12'X1/4 (SUCTIONS) ×1
TUBE CONNECTING 12X1/4 (SUCTIONS) ×2 IMPLANT
TUBING INSUFFLATION (TUBING) ×3 IMPLANT
WARMER LAPAROSCOPE (MISCELLANEOUS) ×3 IMPLANT

## 2015-11-20 NOTE — Discharge Instructions (Signed)
Laparoscopic Cholecystectomy, Care After °Refer to this sheet in the next few weeks. These instructions provide you with information about caring for yourself after your procedure. Your health care provider may also give you more specific instructions. Your treatment has been planned according to current medical practices, but problems sometimes occur. Call your health care provider if you have any problems or questions after your procedure. °WHAT TO EXPECT AFTER THE PROCEDURE °After your procedure, it is common to have: °· Pain at your incision sites. You will be given pain medicines to control your pain. °· Mild nausea or vomiting. This should improve after the first 24 hours. °· Bloating and possible shoulder pain from the gas that was used during the procedure. This will improve after the first 24 hours. °HOME CARE INSTRUCTIONS °Incision Care °· Follow instructions from your health care provider about how to take care of your incisions. Make sure you: °¨ Wash your hands with soap and water before you change your bandage (dressing). If soap and water are not available, use hand sanitizer. °¨ Change your dressing as told by your health care provider. °¨ Leave stitches (sutures), skin glue, or adhesive strips in place. These skin closures may need to be in place for 2 weeks or longer. If adhesive strip edges start to loosen and curl up, you may trim the loose edges. Do not remove adhesive strips completely unless your health care provider tells you to do that. °· Do not take baths, swim, or use a hot tub until your health care provider approves. Ask your health care provider if you can take showers. You may only be allowed to take sponge baths for bathing. °General Instructions °· Take over-the-counter and prescription medicines only as told by your health care provider. °· Do not drive or operate heavy machinery while taking prescription pain medicine. °· Return to your normal diet as told by your health care  provider. °· Do not lift anything that is heavier than 10 lb (4.5 kg). °· Do not play contact sports for one week or until your health care provider approves. °SEEK MEDICAL CARE IF:  °· You have redness, swelling, or pain at the site of your incision. °· You have fluid, blood, or pus coming from your incision. °· You notice a bad smell coming from your incision area. °· Your surgical incisions break open. °· You have a fever. °SEEK IMMEDIATE MEDICAL CARE IF: °· You develop a rash. °· You have difficulty breathing. °· You have chest pain. °· You have increasing pain in your shoulders (shoulder strap areas). °· You faint or have dizzy episodes while you are standing. °· You have severe pain in your abdomen. °· You have nausea or vomiting that lasts for more than one day. °  °This information is not intended to replace advice given to you by your health care provider. Make sure you discuss any questions you have with your health care provider. °  °Document Released: 02/18/2005 Document Revised: 11/09/2014 Document Reviewed: 09/30/2012 °Elsevier Interactive Patient Education ©2016 [employer] ° °

## 2015-11-20 NOTE — Transfer of Care (Signed)
Immediate Anesthesia Transfer of Care Note  Patient: Sandra Brewer  Procedure(s) Performed: Procedure(s): LAPAROSCOPIC CHOLECYSTECTOMY (N/A)  Patient Location: PACU  Anesthesia Type:General  Level of Consciousness: awake, alert , oriented, patient cooperative and responds to stimulation  Airway & Oxygen Therapy: Patient Spontanous Breathing and Patient connected to face mask oxygen  Post-op Assessment: Report given to RN, Post -op Vital signs reviewed and stable and Patient moving all extremities  Post vital signs: Reviewed and stable  Last Vitals:  Vitals:   11/20/15 0830 11/20/15 1000  BP: 124/83   Pulse:    Resp: (!) 23   Temp:  36.8 C    Last Pain:  Vitals:   11/20/15 0756  TempSrc: Oral  PainSc: 5       Patients Stated Pain Goal: 2 (11/20/15 0756)  Complications: No apparent anesthesia complications

## 2015-11-20 NOTE — Op Note (Signed)
Patient:  Sandra JacobsonDonna Brewer  DOB:  07/19/1990  MRN:  [id number]   Preop Diagnosis:  Cholecystitis, cholelithiasis  Postop Diagnosis:  Same  Procedure:  Laparoscopic cholecystectomy  Surgeon:  Franky MachoMark Elexa Kivi, M.D.  Anes:  Gen. endotracheal  Indications:  Patient is a 25 year old white female who presents with right upper quadrant abdominal pain. She has a known history of cholelithiasis. The patient now presents for laparoscopic cholecystectomy. The risks and benefits of the procedure including bleeding, infection, hepatobiliary injury, the possibility of an open procedure were fully explained to the patient, who gave informed consent.  Procedure note:  The patient was placed the supine position. After induction of general endotracheal anesthesia, the abdomen was prepped and draped using the usual sterile technique with DuraPrep. Surgical site confirmation was performed.  A supraumbilical incision was made down to the fascia. A Veress needle was introduced into the abdominal cavity and confirmation of placement was done using the saline drop test. The abdomen was then insufflated to 16 mmHg pressure. An 11 mm trocar was introduced into the abdominal cavity under direct visualization without difficulty. The patient was placed in reverse Trendelenburg position and an additional 11 mm trocar was placed the epigastric region and 5 mm trochars were placed the right upper quadrant and right flank regions. Liver was inspected and noted within normal limits. The gallbladder was noted to be edematous with a stone lodged in the infundibulum. The gallbladder was decompressed and hydrops of the gallbladder was found. The gallbladder was then retracted in a dynamic fashion in order to provide a critical view of the triangle of Calot. The cystic duct was first identified. Its juncture to the infundibulum flow identified. Endoclips were placed proximally and distally on the cystic duct, and the cystic duct was divided.  This was likewise done cystic artery. The gallbladder was freed away from the gallbladder fossa using Bovie electrocautery. The gallbladder was delivered through the epigastric trocar site using an Endo Catch bag. The gallbladder fossa was inspected and no abnormal bleeding or bile leakage was noted. Surgicel was placed the gallbladder fossa. All fluid and air were then evacuated from the abdominal cavity prior to the removal of the trochars.  All wounds were irrigated with normal saline. All wounds were injected with 0.5% Sensorcaine. The supraumbilical fascia as well as epigastric fascia reapproximated using 0 Vicryl interrupted sutures. All skin incisions were closed using staples. Betadine ointment and dry sterile dressings were applied.  All tape and needle counts were correct the end of the procedure. The patient was extubated in the operating room and transferred to PACU in stable condition.  Complications:  None  EBL:  Minimal  Specimen:  Gallbladder

## 2015-11-20 NOTE — Discharge Summary (Signed)
Physician Discharge Summary  Patient ID: Sandra JacobsonDonna Bonaventure MRN: [id number] DOB/AGE: 27/10/1990 27 y.o.  Admit date: 11/18/2015 Discharge date: 11/20/2015  Admission Diagnoses:Acute cholecystitis, cholelithiasis  Discharge Diagnoses: Same Principal Problem:   Acute cholecystitis   Discharged Condition: good  Hospital Course: Patient is a 25 year old white female who presented emergency room with worsening right upper quadrant abdominal pain. She had a known history of gallstones. She was admitted to the hospital for control of her pain and nausea. A surgery consultation was obtained. The patient subsequently underwent a laparoscopic cholecystectomy on 11/20/2015. She tolerated procedure well. Postoperative course has been unremarkable. Her diet was advanced without difficulty. Patient is being discharged home on 11/20/2015 in good and improving condition.  Treatments: surgery: Laparoscopic cholecystectomy on 11/20/2015  Discharge Exam: Blood pressure 119/76, pulse 90, temperature 98.1 F (36.7 C), resp. rate 20, height 5' (1.524 m), weight 72.1 kg (159 lb), last menstrual period 11/07/2015, SpO2 98 %, not currently breastfeeding. General appearance: alert, cooperative and no distress Resp: clear to auscultation bilaterally Cardio: regular rate and rhythm, S1, S2 normal, no murmur, click, rub or gallop GI: Soft, dressings dry and intact.  Disposition: 01-Home or Self Care     Medication List    TAKE these medications   acetaminophen 500 MG tablet Commonly known as:  TYLENOL Take 500 mg by mouth every 6 (six) hours as needed for mild pain.   oxyCODONE-acetaminophen 7.5-325 MG tablet Commonly known as:  PERCOCET Take 1-2 tablets by mouth every 4 (four) hours as needed.      Follow-up Information    Dalia HeadingJENKINS,Elenna Spratling A, MD. Schedule an appointment as soon as possible for a visit on 11/28/2015.   Specialty:  General Surgery Contact information: [address] [place]  [id number] [phone]           Signed: Franky MachoJENKINS,Caidance Sybert A 11/20/2015, 1:44 PM

## 2015-11-20 NOTE — Anesthesia Preprocedure Evaluation (Signed)
Anesthesia Evaluation  Patient identified by MRN, date of birth, ID band Patient awake    Reviewed: Allergy & Precautions, NPO status , Patient's Chart, lab work & pertinent test results  Airway Mallampati: I  TM Distance: >3 FB     Dental  (+) Teeth Intact, Dental Advisory Given   Pulmonary neg pulmonary ROS,    breath sounds clear to auscultation       Cardiovascular negative cardio ROS   Rhythm:Regular Rate:Normal     Neuro/Psych    GI/Hepatic negative GI ROS,   Endo/Other    Renal/GU      Musculoskeletal   Abdominal   Peds  Hematology   Anesthesia Other Findings   Reproductive/Obstetrics                             Anesthesia Physical Anesthesia Plan  ASA: I  Anesthesia Plan: General   Post-op Pain Management:    Induction: Intravenous, Rapid sequence and Cricoid pressure planned  Airway Management Planned: Oral ETT  Additional Equipment:   Intra-op Plan:   Post-operative Plan: Extubation in OR  Informed Consent: I have reviewed the patients History and Physical, chart, labs and discussed the procedure including the risks, benefits and alternatives for the proposed anesthesia with the patient or authorized representative who has indicated his/her understanding and acceptance.     Plan Discussed with:   Anesthesia Plan Comments:         Anesthesia Quick Evaluation

## 2015-11-20 NOTE — Anesthesia Postprocedure Evaluation (Signed)
Anesthesia Post Note  Patient: Myriam JacobsonDonna Sponaugle  Procedure(s) Performed: Procedure(s) (LRB): LAPAROSCOPIC CHOLECYSTECTOMY (N/A)  Patient location during evaluation: PACU Anesthesia Type: General Level of consciousness: awake and alert, oriented, patient uncooperative and responds to stimulation Pain management: pain level controlled Respiratory status: spontaneous breathing, non-rebreather facemask and nonlabored ventilation Cardiovascular status: stable Anesthetic complications: no    Last Vitals:  Vitals:   11/20/15 0830 11/20/15 1000  BP: 124/83 (!) (P) 141/95  Pulse:    Resp: (!) 23 (P) 16  Temp:  36.8 C    Last Pain:  Vitals:   11/20/15 0756  TempSrc: Oral  PainSc: 5                  Wolfe Camarena

## 2015-11-20 NOTE — Anesthesia Procedure Notes (Addendum)
Procedure Name: Intubation Date/Time: 11/20/2015 8:54 AM Performed by: Patrcia DollyMOSES, Samarah Hogle Pre-anesthesia Checklist: Patient identified, Patient being monitored, Timeout performed, Emergency Drugs available and Suction available Patient Re-evaluated:Patient Re-evaluated prior to inductionOxygen Delivery Method: Circle System Utilized Preoxygenation: Pre-oxygenation with 100% oxygen Intubation Type: IV induction, Rapid sequence and Cricoid Pressure applied Ventilation: Mask ventilation without difficulty Laryngoscope Size: Miller and 2 Grade View: Grade I Tube type: Oral Tube size: 7.0 mm Number of attempts: 1 Airway Equipment and Method: Stylet Placement Confirmation: ETT inserted through vocal cords under direct vision,  positive ETCO2 and breath sounds checked- equal and bilateral Secured at: 21 cm Tube secured with: Tape Dental Injury: Teeth and Oropharynx as per pre-operative assessment

## 2015-11-20 NOTE — Addendum Note (Signed)
Addendum  created 11/20/15 1257 by Sonam Wandel, CRNA   Anesthesia Intra Blocks edited, Sign clinical note    

## 2015-11-24 ENCOUNTER — Encounter (HOSPITAL_COMMUNITY): Payer: Self-pay | Admitting: General Surgery

## 2017-07-01 DIAGNOSIS — H5213 Myopia, bilateral: Secondary | ICD-10-CM | POA: Diagnosis not present

## 2017-07-01 DIAGNOSIS — H52223 Regular astigmatism, bilateral: Secondary | ICD-10-CM | POA: Diagnosis not present

## 2017-10-02 ENCOUNTER — Ambulatory Visit: Payer: Self-pay | Admitting: Women's Health

## 2017-10-03 ENCOUNTER — Ambulatory Visit: Payer: Medicaid Other | Admitting: Women's Health

## 2017-10-03 ENCOUNTER — Encounter: Payer: Self-pay | Admitting: Women's Health

## 2017-10-03 VITALS — BP 133/86 | HR 73 | Ht 59.0 in | Wt 165.0 lb

## 2017-10-03 DIAGNOSIS — L292 Pruritus vulvae: Secondary | ICD-10-CM

## 2017-10-03 LAB — POCT WET PREP (WET MOUNT)
Clue Cells Wet Prep Whiff POC: NEGATIVE
Trichomonas Wet Prep HPF POC: ABSENT

## 2017-10-03 NOTE — Progress Notes (Signed)
   GYN VISIT Patient name: Sandra JacobsonDonna Brewer MRN [id number]  Date of birth: 03/24/1990 Chief Complaint:   Vaginal Itching (burning and itching)  History of Present Illness:   Sandra Brewer is a 27 y.o. 732P2002 Caucasian female being seen today for report of vulvovaginal itching/burning 2wks ago, got OTC AZO 7d yeast pills last week, which have completely resolved sx, just kept appt to make sure everything ok.     Patient's last menstrual period was 09/08/2017. The current method of family planning is tubal ligation. Last pap 08/22/15. Results were:  normal Review of Systems:   Pertinent items are noted in HPI Denies fever/chills, dizziness, headaches, visual disturbances, fatigue, shortness of breath, chest pain, abdominal pain, vomiting, abnormal vaginal discharge/itching/odor/irritation, problems with periods, bowel movements, urination, or intercourse unless otherwise stated above.  Pertinent History Reviewed:  Reviewed past medical,surgical, social, obstetrical and family history.  Reviewed problem list, medications and allergies. Physical Assessment:   Vitals:   10/03/17 1144  BP: 133/86  Pulse: 73  Weight: 165 lb (74.8 kg)  Height: 4\' 11"  (1.499 m)  Body mass index is 33.33 kg/m.       Physical Examination:   General appearance: alert, well appearing, and in no distress  Mental status: alert, oriented to person, place, and time  Skin: warm & dry   Cardiovascular: normal heart rate noted  Respiratory: normal respiratory effort, no distress  Abdomen: soft, non-tender   Pelvic: VULVA: normal appearing vulva with no masses, tenderness or lesions, VAGINA: normal appearing vagina with normal color and discharge, no lesions, CERVIX: normal appearing cervix without discharge or lesions  Extremities: no edema   Results for orders placed or performed in visit on 10/03/17 (from the past 24 hour(s))  POCT Wet Prep [facility] D[facility] [place])   Collection Time: 10/03/17 12:03 PM  Result Value Ref Range   Source Wet Prep POC vaginal    WBC, Wet Prep HPF POC none    Bacteria Wet Prep HPF POC Few Few   BACTERIA WET PREP MORPHOLOGY POC     Clue Cells Wet Prep HPF POC None None   Clue Cells Wet Prep Whiff POC Negative Whiff    Yeast Wet Prep HPF POC None None   KOH Wet Prep POC  None   Trichomonas Wet Prep HPF POC Absent Absent    Assessment & Plan:  1) Resolved presumed vulvovaginal candida> let us know if returns  Meds: No orders of the defined types were placed in this encounter.   Orders Placed This Encounter  Procedures  . POCT Wet Prep [facility]([place] [place])    Return for after 6/20 for , Pap & physical.  Cheral MarkerKimberly R Damyia Strider CNM, [facility]NP-BC 10/03/2017 12:04 PM

## 2017-12-15 DIAGNOSIS — Z23 Encounter for immunization: Secondary | ICD-10-CM | POA: Diagnosis not present

## 2018-10-13 ENCOUNTER — Encounter: Payer: Self-pay | Admitting: Women's Health

## 2018-10-13 ENCOUNTER — Ambulatory Visit (INDEPENDENT_AMBULATORY_CARE_PROVIDER_SITE_OTHER): Payer: Medicaid Other | Admitting: Women's Health

## 2018-10-13 ENCOUNTER — Other Ambulatory Visit: Payer: Self-pay

## 2018-10-13 VITALS — BP 131/89 | HR 88 | Ht 60.0 in | Wt 174.0 lb

## 2018-10-13 DIAGNOSIS — N946 Dysmenorrhea, unspecified: Secondary | ICD-10-CM

## 2018-10-13 DIAGNOSIS — G43109 Migraine with aura, not intractable, without status migrainosus: Secondary | ICD-10-CM | POA: Insufficient documentation

## 2018-10-13 MED ORDER — NORETHINDRONE 0.35 MG PO TABS: 1.0000 | ORAL_TABLET | Freq: Every day | ORAL | 3 refills | Status: DC

## 2018-10-13 NOTE — Progress Notes (Signed)
   GYN VISIT Patient name: Sandra Brewer MRN [id number]  Date of birth: May 08, 1990 Chief Complaint:   having period cramps  History of Present Illness:   Sandra Brewer is a 28 y.o. G16P2002 Caucasian female being seen today for report of painful cramps w/ periods x few months. Periods monthly, but irregular, sometimes couple weeks late, last 4-5d, changes tampon q 2-3hrs, bad cramps, small clots. Denies abnormal discharge, itching/odor/irritation.  Had bilateral salpingectomy in 2017. Does not smoke, no h/o HTN, DVT/PE, CVA, MI. Does have migraines w/ aura.   Patient's last menstrual period was 09/14/2018. The current method of family planning is tubal ligation.  Last pap 08/22/15. Results were:  normal Review of Systems:   Pertinent items are noted in HPI Denies fever/chills, dizziness, headaches, visual disturbances, fatigue, shortness of breath, chest pain, abdominal pain, vomiting, abnormal vaginal discharge/itching/odor/irritation, problems with periods, bowel movements, urination, or intercourse unless otherwise stated above.  Pertinent History Reviewed:  Reviewed past medical,surgical, social, obstetrical and family history.  Reviewed problem list, medications and allergies. Physical Assessment:   Vitals:   10/13/18 1342  BP: 131/89  Pulse: 88  Weight: 174 lb (78.9 kg)  Height: 5' (1.524 m)  Body mass index is 33.98 kg/m.       Physical Examination:   General appearance: alert, well appearing, and in no distress  Mental status: alert, oriented to person, place, and time  Skin: warm & dry   Cardiovascular: normal heart rate noted  Respiratory: normal respiratory effort, no distress  Abdomen: soft, non-tender   Pelvic: VULVA: normal appearing vulva with no masses, tenderness or lesions, VAGINA: normal appearing vagina with normal color and discharge, no lesions, CERVIX: normal appearing cervix without discharge or lesions  Extremities: no edema   No results found for this or any  previous visit (from the past 24 hour(s)).  Assessment & Plan:  1) Dysmenorrhea> nuswab sent, rx micronor (d/t migraines w/ aura), f/u 93mths  2) Past due for pap> schedule today  Meds:  Meds ordered this encounter  Medications  . norethindrone (MICRONOR) 0.35 MG tablet    Sig: Take 1 tablet (0.35 mg total) by mouth daily.    Dispense:  3 Package    Refill:  3    Order Specific Question:   Supervising Provider    Answer:   Tania Ade H [[id number]]    Orders Placed This Encounter  Procedures  . NuSwab Vaginitis Plus (VG+)    Return in about 3 months (around 01/13/2019) for Pap & physical.  Roma Schanz CNM, [facility] 10/13/2018 2:03 PM

## 2018-10-16 LAB — NUSWAB VAGINITIS PLUS (VG+)
Candida albicans, NAA: NEGATIVE
Candida glabrata, NAA: NEGATIVE
Chlamydia trachomatis, NAA: NEGATIVE
Neisseria gonorrhoeae, NAA: NEGATIVE
Trich vag by NAA: NEGATIVE

## 2019-01-14 ENCOUNTER — Ambulatory Visit (INDEPENDENT_AMBULATORY_CARE_PROVIDER_SITE_OTHER): Payer: Medicaid Other | Admitting: Women's Health

## 2019-01-14 ENCOUNTER — Other Ambulatory Visit (HOSPITAL_COMMUNITY)
Admission: RE | Admit: 2019-01-14 | Discharge: 2019-01-14 | Disposition: A | Discharge status: Home / Self Care | Payer: Medicaid Other | Source: Ambulatory Visit | Attending: Obstetrics & Gynecology | Admitting: Obstetrics & Gynecology

## 2019-01-14 ENCOUNTER — Encounter: Payer: Self-pay | Admitting: Women's Health

## 2019-01-14 ENCOUNTER — Other Ambulatory Visit: Payer: Self-pay

## 2019-01-14 VITALS — BP 131/88 | HR 71 | Ht 60.0 in | Wt 178.0 lb

## 2019-01-14 DIAGNOSIS — Z0000 Encounter for general adult medical examination without abnormal findings: Secondary | ICD-10-CM

## 2019-01-14 DIAGNOSIS — F419 Anxiety disorder, unspecified: Secondary | ICD-10-CM | POA: Insufficient documentation

## 2019-01-14 DIAGNOSIS — Z01419 Encounter for gynecological examination (general) (routine) without abnormal findings: Secondary | ICD-10-CM

## 2019-01-14 DIAGNOSIS — Z124 Encounter for screening for malignant neoplasm of cervix: Secondary | ICD-10-CM

## 2019-01-14 MED ORDER — CITALOPRAM HYDROBROMIDE 20 MG PO TABS: ORAL_TABLET | ORAL | 2 refills | Status: DC

## 2019-01-14 NOTE — Patient Instructions (Signed)

## 2019-01-14 NOTE — Progress Notes (Signed)
WELL-WOMAN EXAMINATION Patient name: Sandra Brewer MRN [id number]  Date of birth: [birth year] Chief Complaint:   Annual Exam  History of Present Illness:   Sandra Brewer is a 28 y.o. G24P2002 Caucasian female being seen today for a routine well-woman exam and f/u on micronor rx'd 8/11 for painful cramps w/ periods. Had BTL in 2017. Reports cramps are much better. Has period on 1st week of pack, then spots for a few days after, one time spotted for full week after. Denies abnormal discharge, itching/odor/irritation.  Had neg vag swab on 10/13/18.  Reports increased anxiety last few months. Not really depressed. Denies SI/HI. Not sleeping well, decreased appetite, still finds joy in things she used to. No h/o dep/anx. Wants to try meds. Declines counseling at this time.   Depression screen PHQ 2/9 01/14/2019  Decreased Interest 2  Down, Depressed, Hopeless 1  PHQ - 2 Score 3  Altered sleeping 3  Tired, decreased energy 3  Change in appetite 1  Feeling bad or failure about yourself  2  Trouble concentrating 2  Moving slowly or fidgety/restless 0  Suicidal thoughts 0  PHQ-9 Score 14  Difficult doing work/chores Somewhat difficult     PCP: none      does not desire labs Patient's last menstrual period was 01/09/2019. The current method of family planning is oral progesterone-only contraceptive and tubal ligation.  Last pap 08/22/15. Results were: normal. H/O abnormal pap: No Last mammogram: never. Results were: n/a. Family h/o breast cancer: Yes, paternal aunt Last colonoscopy: never. Results were: n/a. Family h/o colorectal cancer: Yes, MGM Review of Systems:   Pertinent items are noted in HPI Denies any headaches, blurred vision, fatigue, shortness of breath, chest pain, abdominal pain, abnormal vaginal discharge/itching/odor/irritation, problems with periods, bowel movements, urination, or intercourse unless otherwise stated above. Pertinent History Reviewed:  Reviewed past  medical,surgical, social and family history.  Reviewed problem list, medications and allergies. Physical Assessment:   Vitals:   01/14/19 1125  BP: 131/88  Pulse: 71  Weight: 178 lb (80.7 kg)  Height: 5' (1.524 m)  Body mass index is 34.76 kg/m.        Physical Examination:   General appearance - well appearing, and in no distress  Mental status - alert, oriented to person, place, and time  Psych:  She has a normal mood and affect  Skin - warm and dry, normal color, no suspicious lesions noted  Chest - effort normal, all lung fields clear to auscultation bilaterally  Heart - normal rate and regular rhythm  Neck:  midline trachea, no thyromegaly or nodules  Breasts - breasts appear normal, no suspicious masses, no skin or nipple changes or  axillary nodes  Abdomen - soft, nontender, nondistended, no masses or organomegaly  Pelvic - VULVA: normal appearing vulva with no masses, tenderness or lesions  VAGINA: normal appearing vagina with normal color and discharge, no lesions  CERVIX: normal appearing cervix without discharge or lesions, no CMT  Thin prep pap is done w/ HR HPV cotesting  UTERUS: uterus is felt to be normal size, shape, consistency and nontender   ADNEXA: No adnexal masses or tenderness noted.  Extremities:  No swelling or varicosities noted  Chaperone: [place]    No results found for this or any previous visit (from the past 24 hour(s)).  Assessment & Plan:  1) Well-Woman Exam  2) Anxiety  3) Postmenstrual spotting> if pap neg for bv/yeast/etc then likely d/t POPs, pt is ok w/ it, just  didn't know if it was normal. Has migraines w/ aura so can't do COC. Discussed option of depo, declines right now  Labs/procedures today: pap  Mammogram @30yo  or sooner if problems Colonoscopy @30yo  or sooner if problems  No orders of the defined types were placed in this encounter.   Meds:  Meds ordered this encounter  Medications  . citalopram (CELEXA) 20 MG  tablet    Sig: Take 10mg  (1/2 tablet) daily x 1 week, then 20mg  (1 tablet) daily thereafter    Dispense:  30 tablet    Refill:  2    Order Specific Question:   Supervising Provider    Answer:   Florian Buff [[id number]]    Follow-up: Return in about 4 weeks (around 02/11/2019) for F/U, MyChart Video w/ me.  Roma Schanz CNM, WHNP-BC 01/14/2019 12:16 PM

## 2019-01-19 LAB — CYTOLOGY - PAP
Chlamydia: NEGATIVE
Comment: NEGATIVE
Comment: NEGATIVE
Comment: NORMAL
Diagnosis: NEGATIVE
High risk HPV: NEGATIVE
Neisseria Gonorrhea: NEGATIVE

## 2019-02-11 ENCOUNTER — Other Ambulatory Visit: Payer: Self-pay

## 2019-02-11 ENCOUNTER — Encounter: Payer: Self-pay | Admitting: Advanced Practice Midwife

## 2019-02-11 ENCOUNTER — Encounter: Payer: Self-pay | Admitting: *Deleted

## 2019-02-11 ENCOUNTER — Telehealth (INDEPENDENT_AMBULATORY_CARE_PROVIDER_SITE_OTHER): Payer: Medicaid Other | Admitting: Advanced Practice Midwife

## 2019-02-11 DIAGNOSIS — F419 Anxiety disorder, unspecified: Secondary | ICD-10-CM | POA: Diagnosis not present

## 2019-02-11 NOTE — Progress Notes (Signed)
   TELEHEALTH VIRTUAL GYNECOLOGY VISIT ENCOUNTER NOTE  I connected with Sandra Brewer on 02/11/19 at 10:30 AM EST by telephone at home and verified that I am speaking with the correct person using two identifiers.   I discussed the limitations, risks, security and privacy concerns of performing an evaluation and management service by telephone and the availability of in person appointments. I also discussed with the patient that there may be a patient responsible charge related to this service. The patient expressed understanding and agreed to proceed.   History:  Sandra Brewer is a 28 y.o. [phone] female being evaluated today for medication follow up. She was started on Celexa (10mg  X 1 week then 20mg /day) 4 weeks ago for anxiety.  She feels like it has started to help some.  She is feeling "a little better" with no concerning side effects.  She declined a therapy referral a month ago, declines again today.       Past Medical History:  Diagnosis Date  . Gall stones    Past Surgical History:  Procedure Laterality Date  . CHOLECYSTECTOMY N/A 11/20/2015   Procedure: LAPAROSCOPIC CHOLECYSTECTOMY;  Surgeon: Aviva Signs, MD;  Location: [facility];  Service: General;  Laterality: N/A;  . LAPAROSCOPIC BILATERAL SALPINGECTOMY Bilateral 10/10/2015   Procedure: LAPAROSCOPIC BILATERAL SALPINGECTOMY;  Surgeon: Jonnie Kind, MD;  Location: [facility];  Service: Gynecology;  Laterality: Bilateral;  . NO PAST SURGERIES     The following portions of the patient's history were reviewed and updated as appropriate: allergies, current medications, past family history, past medical history, past social history, past surgical history and problem list.   Review of Systems:  Pertinent items noted in HPI and remainder of comprehensive ROS otherwise negative.  Physical Exam:   General:  Alert, oriented and cooperative.   Mental Status: Normal mood and affect perceived. Normal judgment and thought content.  Physical exam  deferred due to nature of the encounter  Labs and Imaging No results found for this or any previous visit (from the past 336 hour(s)). No results found.    Assessment and Plan:    Anxiety:  Increase celexa to 30mg  (1.5 tablets).  May increase to 40mg  in 2-3 weeks if still not where she wants to be.  F/U 4 weeks to evaluate again. Let us know if she develops any SE or if things deteriorate.       I discussed the assessment and treatment plan with the patient. The patient was provided an opportunity to ask questions and all were answered. The patient agreed with the plan and demonstrated an understanding of the instructions.   The patient was advised to call back or seek an in-person evaluation/go to the ED if the symptoms worsen or if the condition fails to improve as anticipated.  I provided 15 minutes of non-face-to-face time during this encounter.   Christin Fudge, [place] for [employer], [place]

## 2019-03-06 ENCOUNTER — Other Ambulatory Visit: Payer: Self-pay | Admitting: Advanced Practice Midwife

## 2019-03-06 DIAGNOSIS — Z23 Encounter for immunization: Secondary | ICD-10-CM | POA: Diagnosis not present

## 2019-03-06 MED ORDER — CITALOPRAM HYDROBROMIDE 20 MG PO TABS: ORAL_TABLET | ORAL | 2 refills | Status: DC

## 2019-03-11 ENCOUNTER — Encounter: Payer: Self-pay | Admitting: Obstetrics & Gynecology

## 2019-03-11 ENCOUNTER — Telehealth: Payer: Medicaid Other | Admitting: Women's Health

## 2019-03-11 ENCOUNTER — Telehealth: Payer: Medicaid Other | Admitting: Advanced Practice Midwife

## 2019-03-11 ENCOUNTER — Other Ambulatory Visit: Payer: Self-pay

## 2019-03-11 ENCOUNTER — Telehealth (INDEPENDENT_AMBULATORY_CARE_PROVIDER_SITE_OTHER): Payer: Medicaid Other | Admitting: Obstetrics & Gynecology

## 2019-03-11 DIAGNOSIS — F419 Anxiety disorder, unspecified: Secondary | ICD-10-CM | POA: Diagnosis not present

## 2019-03-11 NOTE — Progress Notes (Signed)
   TELEHEALTH VIRTUAL GYNECOLOGY VISIT ENCOUNTER NOTE  I connected with [name] on 03/11/19 at 10:50 AM EST by telephone at home and verified that I am speaking with the correct person using two identifiers.   I discussed the limitations, risks, security and privacy concerns of performing an evaluation and management service by telephone and the availability of in person appointments. I also discussed with the patient that there may be a patient responsible charge related to this service. The patient expressed understanding and agreed to proceed.   History:  Sandra Brewer is a 29 y.o. G47P2002 female being evaluated today for anxiety and depression. She denies any abnormal vaginal discharge, bleeding, pelvic pain or other concerns.    her celexa dosing was increased last visit from 20 to 30 mg daily and she states that has been helpful and she feels significantly better No suicidal thoughts or    Past Medical History:  Diagnosis Date  . Gall stones    Past Surgical History:  Procedure Laterality Date  . CHOLECYSTECTOMY N/A 11/20/2015   Procedure: LAPAROSCOPIC CHOLECYSTECTOMY;  Surgeon: Franky Macho, MD;  Location: [facility];  Service: General;  Laterality: N/A;  . LAPAROSCOPIC BILATERAL SALPINGECTOMY Bilateral 10/10/2015   Procedure: LAPAROSCOPIC BILATERAL SALPINGECTOMY;  Surgeon: Tilda Burrow, MD;  Location: [facility];  Service: Gynecology;  Laterality: Bilateral;  . NO PAST SURGERIES     The following portions of the patient's history were reviewed and updated as appropriate: allergies, current medications, past family history, past medical history, past social history, past surgical history and problem list.   Health Maintenance:   Review of Systems:  Pertinent items noted in HPI and remainder of comprehensive ROS otherwise negative.  Physical Exam:  Physical exam deferred due to nature of the encounter  Labs and Imaging No results found for this or any previous visit (from the past  336 hour(s)). No results found.     No orders of the defined types were placed in this encounter.   No orders of the defined types were placed in this encounter.   Assessment and Plan:       ICD-10-CM   1. Anxiety  F41.9         continue celexa 30 mg daily Follow up video visit 3 months I discussed the assessment and treatment plan with the patient. The patient was provided an opportunity to ask questions and all were answered. The patient agreed with the plan and demonstrated an understanding of the instructions.   The patient was advised to call back or seek an in-person evaluation/go to the ED if the symptoms worsen or if the condition fails to improve as anticipated.  I provided 11 minutes of non-face-to-face time during this encounter.   Lazaro Arms, [place] [facility] for [facility]

## 2019-06-02 ENCOUNTER — Other Ambulatory Visit: Payer: Self-pay | Admitting: Advanced Practice Midwife

## 2019-06-04 ENCOUNTER — Telehealth: Payer: Medicaid Other | Admitting: Obstetrics & Gynecology

## 2019-06-07 ENCOUNTER — Telehealth: Payer: Medicaid Other | Admitting: Obstetrics & Gynecology

## 2019-06-14 ENCOUNTER — Telehealth (INDEPENDENT_AMBULATORY_CARE_PROVIDER_SITE_OTHER): Payer: Medicaid Other | Admitting: Obstetrics & Gynecology

## 2019-06-14 ENCOUNTER — Other Ambulatory Visit: Payer: Self-pay

## 2019-06-14 ENCOUNTER — Encounter: Payer: Self-pay | Admitting: Obstetrics & Gynecology

## 2019-06-14 DIAGNOSIS — F419 Anxiety disorder, unspecified: Secondary | ICD-10-CM | POA: Diagnosis not present

## 2019-06-14 MED ORDER — DESOGESTREL-ETHINYL ESTRADIOL 0.15-0.02/0.01 MG (21/5) PO TABS: 1.0000 | ORAL_TABLET | Freq: Every day | ORAL | 11 refills | Status: DC

## 2019-06-14 NOTE — Progress Notes (Signed)
Chief Complaint  Patient presents with  . Follow-up   MyChart Connect Video visit   29 y.o. [id number] No LMP recorded. The current method of family planning is tubal ligation.  Outpatient Encounter Medications as of 06/14/2019  Medication Sig  . acetaminophen (TYLENOL) 500 MG tablet Take 500 mg by mouth every 6 (six) hours as needed for mild pain.  Marland Kitchen norethindrone (MICRONOR) 0.35 MG tablet Take 1 tablet (0.35 mg total) by mouth daily.  Marland Kitchen omeprazole (PRILOSEC) 20 MG capsule omeprazole 20 mg capsule,delayed release  TAKE ONE CAPSULE BY MOUTH EVERY DAY  . [DISCONTINUED] citalopram (CELEXA) 20 MG tablet TAKE 1 AND 1/2 TABLETS (30MG ) BY MOUTH DAILY  . desogestrel-ethinyl estradiol (MIRCETTE) 0.15-0.02/0.01 MG (21/5) tablet Take 1 tablet by mouth daily.   No facility-administered encounter medications on file as of 06/14/2019.    Subjective Pt on celexa 30 mg daily and has only 1 or 2 left Wants to continue Doing well  Past Medical History:  Diagnosis Date  . Gall stones     Past Surgical History:  Procedure Laterality Date  . CHOLECYSTECTOMY N/A 11/20/2015   Procedure: LAPAROSCOPIC CHOLECYSTECTOMY;  Surgeon: Aviva Signs, MD;  Location: [facility];  Service: General;  Laterality: N/A;  . LAPAROSCOPIC BILATERAL SALPINGECTOMY Bilateral 10/10/2015   Procedure: LAPAROSCOPIC BILATERAL SALPINGECTOMY;  Surgeon: Jonnie Kind, MD;  Location: [facility];  Service: Gynecology;  Laterality: Bilateral;  . NO PAST SURGERIES      OB History    Gravida  2   Para  2   Term  2   Preterm      AB      Living  2     SAB      TAB      Ectopic      Multiple  0   Live Births  2           No Known Allergies  Social History   Socioeconomic History  . Marital status: Married    Spouse name: Not on file  . Number of children: Not on file  . Years of education: Not on file  . Highest education level: Not on file  Occupational History  . Not on file  Tobacco Use  . Smoking  status: Never Smoker  . Smokeless tobacco: Never Used  Vaping Use  . Vaping Use: Never used  Substance and Sexual Activity  . Alcohol use: No  . Drug use: No  . Sexual activity: Yes    Birth control/protection: Surgical, Pill    Comment: tubal  Other Topics Concern  . Not on file  Social History Narrative  . Not on file   Social Determinants of Health   Financial Resource Strain:   . Difficulty of Paying Living Expenses:   Food Insecurity:   . Worried About Charity fundraiser in the Last Year:   . Arboriculturist in the Last Year:   Transportation Needs:   . Film/video editor (Medical):   Marland Kitchen Lack of Transportation (Non-Medical):   Physical Activity:   . Days of Exercise per Week:   . Minutes of Exercise per Session:   Stress:   . Feeling of Stress :   Social Connections:   . Frequency of Communication with Friends and Family:   . Frequency of Social Gatherings with Friends and Family:   . Attends Religious Services:   . Active Member of Clubs or Organizations:   . Attends Club  or Organization Meetings:   Marland Kitchen Marital Status:     Family History  Problem Relation Age of Onset  . Hypertension Mother   . Hypertension Father   . Heart disease Father   . Heart attack Father     Medications:       Current Outpatient Medications:  .  acetaminophen (TYLENOL) 500 MG tablet, Take 500 mg by mouth every 6 (six) hours as needed for mild pain., Disp: , Rfl:  .  norethindrone (MICRONOR) 0.35 MG tablet, Take 1 tablet (0.35 mg total) by mouth daily., Disp: 3 Package, Rfl: 3 .  omeprazole (PRILOSEC) 20 MG capsule, omeprazole 20 mg capsule,delayed release  TAKE ONE CAPSULE BY MOUTH EVERY DAY, Disp: , Rfl:  .  citalopram (CELEXA) 20 MG tablet, TAKE 1 AND 1/2 TABLETS (30MG ) BY MOUTH DAILY, Disp: 45 tablet, Rfl: 2 .  desogestrel-ethinyl estradiol (MIRCETTE) 0.15-0.02/0.01 MG (21/5) tablet, Take 1 tablet by mouth daily., Disp: 1 Package, Rfl: 11  Objective There were no vitals taken  for this visit.  Gen WDWN NAD  Pertinent ROS   Labs or studies     Impression Diagnoses this Encounter::   ICD-10-CM   1. Anxiety  F41.9    contineu celexa 30 mg daily    Established relevant diagnosis(es):   Plan/Recommendations: Meds ordered this encounter  Medications  . desogestrel-ethinyl estradiol (MIRCETTE) 0.15-0.02/0.01 MG (21/5) tablet    Sig: Take 1 tablet by mouth daily.    Dispense:  1 Package    Refill:  11    Labs or Scans Ordered: No orders of the defined types were placed in this encounter.   Management:: Continue celexa, begin 20 mic EE desogestrel OCP  Follow up No follow-ups on file.        Video Face to face time: 10  minutes  Greater than 50% of the visit time was spent in counseling and coordination of care with the patient.  The summary and outline of the counseling and care coordination is summarized in the note above.   All questions were answered.

## 2019-07-21 ENCOUNTER — Other Ambulatory Visit: Payer: Self-pay | Admitting: *Deleted

## 2019-07-22 MED ORDER — CITALOPRAM HYDROBROMIDE 20 MG PO TABS: ORAL_TABLET | ORAL | 2 refills | Status: DC

## 2019-10-01 ENCOUNTER — Other Ambulatory Visit: Payer: Self-pay | Admitting: Obstetrics & Gynecology

## 2019-11-01 ENCOUNTER — Other Ambulatory Visit: Payer: Self-pay | Admitting: Obstetrics & Gynecology

## 2020-05-13 ENCOUNTER — Other Ambulatory Visit: Payer: Self-pay | Admitting: Obstetrics & Gynecology

## 2020-06-26 ENCOUNTER — Ambulatory Visit (INDEPENDENT_AMBULATORY_CARE_PROVIDER_SITE_OTHER): Payer: No Typology Code available for payment source | Admitting: Internal Medicine

## 2020-06-26 ENCOUNTER — Encounter: Payer: Self-pay | Admitting: Internal Medicine

## 2020-06-26 ENCOUNTER — Other Ambulatory Visit: Payer: Self-pay

## 2020-06-26 VITALS — BP 116/72 | HR 93 | Temp 98.6°F | Resp 16 | Ht 60.0 in | Wt 151.0 lb

## 2020-06-26 DIAGNOSIS — G43019 Migraine without aura, intractable, without status migrainosus: Secondary | ICD-10-CM | POA: Diagnosis not present

## 2020-06-26 DIAGNOSIS — G4452 New daily persistent headache (NDPH): Secondary | ICD-10-CM | POA: Diagnosis not present

## 2020-06-26 DIAGNOSIS — Z0001 Encounter for general adult medical examination with abnormal findings: Secondary | ICD-10-CM | POA: Diagnosis not present

## 2020-06-26 DIAGNOSIS — R0683 Snoring: Secondary | ICD-10-CM | POA: Diagnosis not present

## 2020-06-26 LAB — CBC WITH DIFFERENTIAL/PLATELET
Basophils Absolute: 0 10*3/uL (ref 0.0–0.1)
Basophils Relative: 0.7 % (ref 0.0–3.0)
Eosinophils Absolute: 0.1 10*3/uL (ref 0.0–0.7)
Eosinophils Relative: 1.3 % (ref 0.0–5.0)
HCT: 35.7 % — ABNORMAL LOW (ref 36.0–46.0)
Hemoglobin: 12.1 g/dL (ref 12.0–15.0)
Lymphocytes Relative: 36.6 % (ref 12.0–46.0)
Lymphs Abs: 2.4 10*3/uL (ref 0.7–4.0)
MCHC: 33.9 g/dL (ref 30.0–36.0)
MCV: 90.7 fl (ref 78.0–100.0)
Monocytes Absolute: 0.6 10*3/uL (ref 0.1–1.0)
Monocytes Relative: 8.7 % (ref 3.0–12.0)
Neutro Abs: 3.5 10*3/uL (ref 1.4–7.7)
Neutrophils Relative %: 52.7 % (ref 43.0–77.0)
Platelets: 312 10*3/uL (ref 150.0–400.0)
RBC: 3.93 Mil/uL (ref 3.87–5.11)
RDW: 12.6 % (ref 11.5–15.5)
WBC: 6.7 10*3/uL (ref 4.0–10.5)

## 2020-06-26 LAB — TSH: TSH: 1.74 u[IU]/mL (ref 0.35–4.50)

## 2020-06-26 LAB — SEDIMENTATION RATE: Sed Rate: 33 mm/hr — ABNORMAL HIGH (ref 0–20)

## 2020-06-26 MED ORDER — QULIPTA 60 MG PO TABS: 1.0000 | ORAL_TABLET | Freq: Every day | ORAL | 1 refills | Status: DC

## 2020-06-26 MED ORDER — QULIPTA 60 MG PO TABS: 1.0000 | ORAL_TABLET | Freq: Every day | ORAL | 0 refills | Status: DC

## 2020-06-26 NOTE — Progress Notes (Signed)
Subjective:  Patient ID: Sandra Brewer, female    DOB: May 10, 1990  Age: 30 y.o. MRN: [id number]  CC: Annual Exam and Headache  This visit occurred during the SARS-CoV-2 public health emergency.  Safety protocols were in place, including screening questions prior to the visit, additional usage of staff PPE, and extensive cleaning of exam room while observing appropriate contact time as indicated for disinfecting solutions.    HPI Shawnte Winton presents for a CPX and to establish.  She complains of headaches for at least a year.  She tells me that her previous doctor told her it was migraines but she never had an imaging done of her brain.  She complains of a throbbing sensation in her frontal area with photo and phono sensitivity that has become nearly a daily problem.  She tells me she does not get much symptom relief with Tylenol.  She has not tried.any other options.  She has generalized anxiety disorder that is well controlled with citalopram.  She denies any recent episodes of nausea and vomiting.  She tells me that she has paresthesias on her scalp with the headaches but no peripheral paresthesias.  She denies ataxia, slurred speech, or difficulty with word finding.  She also tells me that her husband complains about her snoring.  History Genella has a past medical history of Gall stones.   She has a past surgical history that includes No past surgeries; Laparoscopic bilateral salpingectomy (Bilateral, 10/10/2015); and Cholecystectomy (N/A, 11/20/2015).   Her family history includes Heart attack in her father; Heart disease in her father; Hypertension in her father and mother.She reports that she has never smoked. She has never used smokeless tobacco. She reports that she does not drink alcohol and does not use drugs.  Outpatient Medications Prior to Visit  Medication Sig Dispense Refill  . acetaminophen (TYLENOL) 500 MG tablet Take 500 mg by mouth every 6 (six) hours as needed for mild pain.     . citalopram (CELEXA) 20 MG tablet TAKE 1 AND 1/2 TABLET BY MOUTH EVERY DAY 135 tablet 2  . KARIVA 0.15-0.02/0.01 MG (21/5) tablet TAKE 1 TABLET BY MOUTH EVERY DAY 84 tablet 3  . norethindrone (MICRONOR) 0.35 MG tablet Take 1 tablet (0.35 mg total) by mouth daily. 3 Package 3  . omeprazole (PRILOSEC) 20 MG capsule omeprazole 20 mg capsule,delayed release  TAKE ONE CAPSULE BY MOUTH EVERY DAY     No facility-administered medications prior to visit.    ROS Review of Systems  Constitutional: Negative for appetite change, diaphoresis, fatigue, fever and unexpected weight change.  HENT: Negative.  Negative for trouble swallowing and voice change.   Eyes: Positive for photophobia. Negative for visual disturbance.  Respiratory: Negative for apnea, cough, shortness of breath and wheezing.   Cardiovascular: Negative for chest pain, palpitations and leg swelling.  Gastrointestinal: Negative for abdominal pain, constipation, diarrhea, nausea and vomiting.  Endocrine: Negative.   Genitourinary: Negative.  Negative for difficulty urinating.  Musculoskeletal: Negative for back pain and myalgias.  Skin: Negative.   Neurological: Positive for headaches. Negative for dizziness, tremors, seizures, syncope, speech difficulty, weakness and light-headedness.  Hematological: Negative for adenopathy. Does not bruise/bleed easily.  Psychiatric/Behavioral: Negative for confusion, decreased concentration, dysphoric mood, sleep disturbance and suicidal ideas. The patient is nervous/anxious.     Objective:  BP 116/72   Pulse 93   Temp 98.6 F (37 C) (Oral)   Resp 16   Ht 5' (1.524 m)   Wt 151 lb (68.5 kg)  LMP 06/14/2020 Comment: s/p BTL  SpO2 97%   BMI 29.49 kg/m   Physical Exam Vitals reviewed.  Constitutional:      Appearance: She is well-developed.  Eyes:     General: No scleral icterus.    Extraocular Movements: Extraocular movements intact.     Pupils: Pupils are equal, round, and reactive  to light.  Cardiovascular:     Rate and Rhythm: Normal rate and regular rhythm.     Heart sounds: No murmur heard.   Pulmonary:     Effort: Pulmonary effort is normal.     Breath sounds: No stridor. No wheezing, rhonchi or rales.  Abdominal:     General: Abdomen is flat. Bowel sounds are normal. There is no distension.     Palpations: Abdomen is soft. There is no hepatomegaly, splenomegaly or mass.     Tenderness: There is no abdominal tenderness.  Musculoskeletal:        General: Normal range of motion.     Cervical back: Normal range of motion.     Right lower leg: No edema.     Left lower leg: No edema.  Skin:    General: Skin is warm and dry.     Coloration: Skin is not pale.     Findings: No rash.  Neurological:     General: No focal deficit present.     Mental Status: She is alert and oriented to person, place, and time. Mental status is at baseline.     Cranial Nerves: Cranial nerves are intact. No cranial nerve deficit.     Sensory: Sensation is intact. No sensory deficit.     Motor: Motor function is intact. No weakness or tremor.     Coordination: Coordination is intact. Coordination normal. Finger-Nose-Finger Test normal.     Gait: Gait normal.     Deep Tendon Reflexes: Reflexes normal. Babinski sign absent on the right side. Babinski sign absent on the left side.     Reflex Scores:      Tricep reflexes are 1+ on the right side and 1+ on the left side.      Bicep reflexes are 1+ on the right side and 1+ on the left side.      Brachioradialis reflexes are 1+ on the right side and 1+ on the left side.      Patellar reflexes are 2+ on the right side and 2+ on the left side.      Achilles reflexes are 0 on the right side and 0 on the left side. Psychiatric:        Mood and Affect: Mood normal.        Behavior: Behavior normal.        Thought Content: Thought content normal.        Judgment: Judgment normal.       Lab Results  Component Value Date   WBC 6.7  06/26/2020   HGB 12.1 06/26/2020   HCT 35.7 (L) 06/26/2020   PLT 312.0 06/26/2020   GLUCOSE 103 (H) 06/26/2020   CHOL 180 06/26/2020   TRIG 149.0 06/26/2020   HDL 48.60 06/26/2020   LDLCALC 102 (H) 06/26/2020   ALT 11 06/26/2020   AST 14 06/26/2020   NA 139 06/26/2020   K 4.3 06/26/2020   CL 103 06/26/2020   CREATININE 0.66 06/26/2020   BUN 10 06/26/2020   CO2 26 06/26/2020   TSH 1.74 06/26/2020   INR 0.95 11/18/2015    Assessment & Plan:  Halei was seen today for annual exam and headache.  Diagnoses and all orders for this visit:  New daily persistent headache- She has worsening headaches.  The only remarkable finding on her labs are an elevated sed rate.  Her neurologic exam is within normal limits.  I recommended that she undergo an MRI of the brain to screen for mass, tumor, NPH, or bleeding. -     CBC with Differential/Platelet; Future -     Basic metabolic panel; Future -     TSH; Future -     Hepatic function panel; Future -     Sedimentation rate; Future -     MR Brain Wo Contrast; Future -     Sedimentation rate -     Hepatic function panel -     TSH -     Basic metabolic panel -     CBC with Differential/Platelet  Intractable migraine without aura and without status migrainosus- I recommended that she treat this with a CGRP antagonist. -     Discontinue: Atogepant (QULIPTA) 60 MG TABS; Take 1 tablet by mouth daily. -     Atogepant (QULIPTA) 60 MG TABS; Take 1 tablet by mouth daily.  Snoring- I recommended that she be evaluated by sleep medicine. -     TSH; Future -     Ambulatory referral to Sleep Studies -     TSH  Encounter for general adult medical examination with abnormal findings- Exam completed, labs reviewed, vaccines are up-to-date, cervical cancer screening is up-to-date, patient education was given. -     Lipid panel; Future -     Lipid panel   I have discontinued Rogenia Werntz "Taci Siefring"'s norethindrone and omeprazole. I am also having her  maintain her acetaminophen, citalopram, Garnette Scheuermann, and Qulipta.  Meds ordered this encounter  Medications  . DISCONTD: Atogepant (QULIPTA) 60 MG TABS    Sig: Take 1 tablet by mouth daily.    Dispense:  12 tablet    Refill:  0  . Atogepant (QULIPTA) 60 MG TABS    Sig: Take 1 tablet by mouth daily.    Dispense:  90 tablet    Refill:  1     Follow-up: Return in about 3 months (around 09/25/2020).  Sanda Linger, MD

## 2020-06-26 NOTE — Patient Instructions (Signed)

## 2020-06-27 LAB — HEPATIC FUNCTION PANEL
ALT: 11 U/L (ref 0–35)
AST: 14 U/L (ref 0–37)
Albumin: 4.1 g/dL (ref 3.5–5.2)
Alkaline Phosphatase: 45 U/L (ref 39–117)
Bilirubin, Direct: 0 mg/dL (ref 0.0–0.3)
Total Bilirubin: 0.3 mg/dL (ref 0.2–1.2)
Total Protein: 7.7 g/dL (ref 6.0–8.3)

## 2020-06-27 LAB — LIPID PANEL
Cholesterol: 180 mg/dL (ref 0–200)
HDL: 48.6 mg/dL (ref >39.00)
LDL Cholesterol: 102 mg/dL — ABNORMAL HIGH (ref 0–99)
NonHDL: 131.6
Total CHOL/HDL Ratio: 4
Triglycerides: 149 mg/dL (ref 0.0–149.0)
VLDL: 29.8 mg/dL (ref 0.0–40.0)

## 2020-06-27 LAB — BASIC METABOLIC PANEL
BUN: 10 mg/dL (ref 6–23)
CO2: 26 mEq/L (ref 19–32)
Calcium: 9.5 mg/dL (ref 8.4–10.5)
Chloride: 103 mEq/L (ref 96–112)
Creatinine, Ser: 0.66 mg/dL (ref 0.40–1.20)
GFR: 116.58 mL/min (ref >60.00)
Glucose, Bld: 103 mg/dL — ABNORMAL HIGH (ref 70–99)
Potassium: 4.3 mEq/L (ref 3.5–5.1)
Sodium: 139 mEq/L (ref 135–145)

## 2020-06-29 ENCOUNTER — Encounter: Payer: Self-pay | Admitting: Internal Medicine

## 2020-07-03 ENCOUNTER — Ambulatory Visit
Admission: RE | Admit: 2020-07-03 | Discharge: 2020-07-03 | Disposition: A | Discharge status: Home / Self Care | Payer: No Typology Code available for payment source | Source: Ambulatory Visit | Attending: Internal Medicine | Admitting: Internal Medicine

## 2020-07-03 ENCOUNTER — Other Ambulatory Visit: Payer: Self-pay

## 2020-07-03 DIAGNOSIS — G4452 New daily persistent headache (NDPH): Secondary | ICD-10-CM

## 2020-07-18 ENCOUNTER — Other Ambulatory Visit: Payer: Self-pay | Admitting: Obstetrics & Gynecology

## 2020-08-17 ENCOUNTER — Ambulatory Visit (INDEPENDENT_AMBULATORY_CARE_PROVIDER_SITE_OTHER): Payer: No Typology Code available for payment source | Admitting: Neurology

## 2020-08-17 ENCOUNTER — Other Ambulatory Visit: Payer: Self-pay

## 2020-08-17 ENCOUNTER — Encounter: Payer: Self-pay | Admitting: Neurology

## 2020-08-17 VITALS — BP 122/82 | HR 72 | Ht 60.0 in | Wt 146.0 lb

## 2020-08-17 DIAGNOSIS — Z8669 Personal history of other diseases of the nervous system and sense organs: Secondary | ICD-10-CM | POA: Diagnosis not present

## 2020-08-17 DIAGNOSIS — G2581 Restless legs syndrome: Secondary | ICD-10-CM | POA: Diagnosis not present

## 2020-08-17 DIAGNOSIS — R0683 Snoring: Secondary | ICD-10-CM

## 2020-08-17 DIAGNOSIS — G478 Other sleep disorders: Secondary | ICD-10-CM | POA: Diagnosis not present

## 2020-08-17 DIAGNOSIS — R519 Headache, unspecified: Secondary | ICD-10-CM | POA: Diagnosis not present

## 2020-08-17 DIAGNOSIS — E663 Overweight: Secondary | ICD-10-CM | POA: Diagnosis not present

## 2020-08-17 NOTE — Patient Instructions (Signed)

## 2020-08-17 NOTE — Progress Notes (Signed)
Subjective:    Patient ID: Sandra Brewer is a 30 y.o. female.  HPI    Sandra Foley, MD, PhD [facility] [address], [address] [address] [place], [place] [address]  Dear Dr. Neva Seat,   I saw your patient, Sandra Brewer, upon your kind request in my sleep clinic today for initial consultation of her sleep disorder, in particular, concern for underlying obstructive sleep apnea.  The patient is unaccompanied today.  As you know, Ms. Sandra Brewer is a 30 year old right-handed woman with an underlying medical history of migraine headaches, anxiety, and overweight state, who reports snoring and some daytime tiredness and nonrestorative sleep.  She has occasionally woken up with a headache.  She does not have night to night nocturia.  She is not aware of any family history of sleep apnea.  She has occasional restlessness and restless legs type symptoms, difficulty relaxing her legs at night but is not known to twitch or kick her legs or feet while asleep.  She has not woken up with a sense of gasping for air.  She has been working on weight loss.  She has lost a little bit of weight, since last year, since her current job requires more physical activity.  She works as a Engineer, civil (consulting) with [employer].  He lives with her husband and 2 children.  She is a non-smoker and drinks alcohol very occasionally, drinks caffeine in the form of soda, 1 or 2 glasses/day.  Bedtime is around 10 PM and rise time around 6 AM.  She does not always wake up feeling rested. I reviewed your office note from 06/26/2020.  Her Epworth sleepiness score is 5 out of 24, fatigue severity score is 19 out of 63.  She has a history of migraines, she is currently on Qulipta 60 mg daily for prevention. Overall, migraines are under reasonable control with daily prevention and Advil as needed.  They have no pets in the household.  She does have a TV in the bedroom but typically turns it off in our or 2 before her bedtime.  Her Past  Medical History Is Significant For: Past Medical History:  Diagnosis Date   Gall stones     Her Past Surgical History Is Significant For: Past Surgical History:  Procedure Laterality Date   CHOLECYSTECTOMY N/A 11/20/2015   Procedure: LAPAROSCOPIC CHOLECYSTECTOMY;  Surgeon: Franky Macho, MD;  Location: [facility];  Service: General;  Laterality: N/A;   LAPAROSCOPIC BILATERAL SALPINGECTOMY Bilateral 10/10/2015   Procedure: LAPAROSCOPIC BILATERAL SALPINGECTOMY;  Surgeon: Tilda Burrow, MD;  Location: [facility];  Service: Gynecology;  Laterality: Bilateral;   NO PAST SURGERIES      Her Family History Is Significant For: Family History  Problem Relation Age of Onset   Hypertension Mother    Hypertension Father    Heart disease Father    Heart attack Father     Her Social History Is Significant For: Social History   Socioeconomic History   Marital status: Married    Spouse name: Not on file   Number of children: Not on file   Years of education: Not on file   Highest education level: Not on file  Occupational History   Not on file  Tobacco Use   Smoking status: Never   Smokeless tobacco: Never  Vaping Use   Vaping Use: Never used  Substance and Sexual Activity   Alcohol use: No   Drug use: No   Sexual activity: Yes  Birth control/protection: Surgical, Pill    Comment: tubal  Other Topics Concern   Not on file  Social History Narrative   Not on file   Social Determinants of Health   Financial Resource Strain: Not on file  Food Insecurity: Not on file  Transportation Needs: Not on file  Physical Activity: Not on file  Stress: Not on file  Social Connections: Not on file    Her Allergies Are:  No Known Allergies:   Her Current Medications Are:  Outpatient Encounter Medications as of 08/17/2020  Medication Sig   Atogepant (QULIPTA) 60 MG TABS Take 1 tablet by mouth daily.   citalopram (CELEXA) 20 MG tablet TAKE 1 AND 1/2 TABLETS DAILY BY MOUTH   KARIVA  0.15-0.02/0.01 MG (21/5) tablet TAKE 1 TABLET BY MOUTH EVERY DAY   [DISCONTINUED] acetaminophen (TYLENOL) 500 MG tablet Take 500 mg by mouth every 6 (six) hours as needed for mild pain.   No facility-administered encounter medications on file as of 08/17/2020.  :   Review of Systems:  Out of a complete 14 point review of systems, all are reviewed and negative with the exception of these symptoms as listed below:  Review of Systems  Neurological:        Here for sleep consult. No prior sleep study reports some snoring is present.  Hx of migraines and headache.  Epworth Sleepiness Scale 0= would never doze 1= slight chance of dozing 2= moderate chance of dozing 3= high chance of dozing  Sitting and reading:1 Watching TV:1 Sitting inactive in a public place (ex. Theater or meeting):0 As a passenger in a car for an hour without a break:1 Lying down to rest in the afternoon:2 Sitting and talking to someone:0 Sitting quietly after lunch (no alcohol):0 In a car, while stopped in traffic:0 Total:5    Objective:  Neurological Exam  Physical Exam Physical Examination:   Vitals:   08/17/20 1228  BP: 122/82  Pulse: 72    General Examination: The patient is a very pleasant 30 y.o. female in no acute distress. She appears well-developed and well-nourished and well groomed.   HEENT: Normocephalic, atraumatic, pupils are equal, round and reactive to light, extraocular tracking is good without limitation to gaze excursion or nystagmus noted. Hearing is grossly intact. Face is symmetric with normal facial animation. Speech is clear with no dysarthria noted. There is no hypophonia. There is no lip, neck/head, jaw or voice tremor. Neck is supple with full range of passive and active motion. There are no carotid bruits on auscultation. Oropharynx exam reveals: mild mouth dryness, good dental hygiene and mild airway crowding, due to small airway entry, Mallampati class II, tonsillar size of  about 1+ bilaterally.  Tongue protrudes centrally and palate elevates symmetrically.  She has a minimal overbite.  Neck circumference of 14 1/4 inches.  Chest: Clear to auscultation without wheezing, rhonchi or crackles noted.  Heart: S1+S2+0, regular and normal without murmurs, rubs or gallops noted.   Abdomen: Soft, non-tender and non-distended with normal bowel sounds appreciated on auscultation.  Extremities: There is no pitting edema in the distal lower extremities bilaterally.   Skin: Warm and dry without trophic changes noted.   Musculoskeletal: exam reveals no obvious joint deformities, tenderness or joint swelling or erythema.   Neurologically:  Mental status: The patient is awake, alert and oriented in all 4 spheres. Her immediate and remote memory, attention, language skills and fund of knowledge are appropriate. There is no evidence of aphasia, agnosia, apraxia  or anomia. Speech is clear with normal prosody and enunciation. Thought process is linear. Mood is normal and affect is normal.  Cranial nerves II - XII are as described above under HEENT exam.  Motor exam: Normal bulk, strength and tone is noted. There is no tremor, Romberg is negative. Fine motor skills and coordination: grossly intact.  Cerebellar testing: No dysmetria or intention tremor. There is no truncal or gait ataxia.  Sensory exam: intact to light touch in the upper and lower extremities.  Gait, station and balance: She stands easily. No veering to one side is noted. No leaning to one side is noted. Posture is age-appropriate and stance is narrow based. Gait shows normal stride length and normal pace. No problems turning are noted. Tandem walk is unremarkable.                Assessment and Plan:  In summary, Chaquita Basques is a very pleasant 30 y.o. female with an underlying medical history of migraine headaches, anxiety, and overweight state, whose history and physical exam are concerning for obstructive sleep apnea  (OSA). I had a long chat with the patient an about my findings and the diagnosis of OSA, its prognosis and treatment options. We talked about medical treatments, surgical interventions and non-pharmacological approaches. I explained in particular the risks and ramifications of untreated moderate to severe OSA, especially with respect to developing cardiovascular disease down the Road, including congestive heart failure, difficult to treat hypertension, cardiac arrhythmias, or stroke. Even type 2 diabetes has, in part, been linked to untreated OSA. Symptoms of untreated OSA include daytime sleepiness, memory problems, mood irritability and mood disorder such as depression and anxiety, lack of energy, as well as recurrent headaches, especially morning headaches. We talked about trying to maintain a healthy lifestyle in general, as well as the importance of weight control. We also talked about the importance of good sleep hygiene. I recommended the following at this time: sleep study.  I explained the sleep test procedure to the patient and also outlined possible surgical and non-surgical treatment options of OSA, including the use of a custom-made dental device (which would require a referral to a specialist dentist or oral surgeon), upper airway surgical options, such as traditional UPPP or a novel less invasive surgical option in the form of Inspire hypoglossal nerve stimulation (which would involve a referral to an ENT surgeon). I also explained the PAP treatment option to the patient.  She would consider positive airway pressure treatment if the need arises.  For now, we will proceed with testing and pick up our discussion about treatment options after results are in.  I answered all her questions today and the patient was in agreement. I plan to see her back after the sleep study is completed and encouraged her to call with any interim questions, concerns, problems or updates.   Thank you very much for  allowing me to participate in the care of this nice patient. If I can be of any further assistance to you please do not hesitate to call me at [phone].  Sincerely,   Sandra Foley, MD, PhD

## 2020-08-28 ENCOUNTER — Telehealth: Payer: Self-pay

## 2020-08-28 NOTE — Telephone Encounter (Signed)
Called pt to get her scheduled for sleep study. Unable to lvm due to voicemail not being set up. I did send the pt a my chart msg.

## 2020-09-18 ENCOUNTER — Ambulatory Visit (INDEPENDENT_AMBULATORY_CARE_PROVIDER_SITE_OTHER): Payer: No Typology Code available for payment source | Admitting: Neurology

## 2020-09-18 DIAGNOSIS — G478 Other sleep disorders: Secondary | ICD-10-CM

## 2020-09-18 DIAGNOSIS — G2581 Restless legs syndrome: Secondary | ICD-10-CM

## 2020-09-18 DIAGNOSIS — R0683 Snoring: Secondary | ICD-10-CM | POA: Diagnosis not present

## 2020-09-18 DIAGNOSIS — E663 Overweight: Secondary | ICD-10-CM

## 2020-09-18 DIAGNOSIS — Z8669 Personal history of other diseases of the nervous system and sense organs: Secondary | ICD-10-CM

## 2020-09-18 DIAGNOSIS — R519 Headache, unspecified: Secondary | ICD-10-CM

## 2020-09-19 NOTE — Progress Notes (Signed)
See procedure note.

## 2020-09-25 ENCOUNTER — Other Ambulatory Visit: Payer: Self-pay

## 2020-09-25 ENCOUNTER — Encounter: Payer: Self-pay | Admitting: Internal Medicine

## 2020-09-25 ENCOUNTER — Ambulatory Visit (INDEPENDENT_AMBULATORY_CARE_PROVIDER_SITE_OTHER): Payer: No Typology Code available for payment source | Admitting: Internal Medicine

## 2020-09-25 VITALS — BP 116/70 | HR 75 | Temp 98.5°F | Ht 60.0 in | Wt 144.0 lb

## 2020-09-25 DIAGNOSIS — G43019 Migraine without aura, intractable, without status migrainosus: Secondary | ICD-10-CM

## 2020-09-25 DIAGNOSIS — D539 Nutritional anemia, unspecified: Secondary | ICD-10-CM

## 2020-09-25 LAB — CBC WITH DIFFERENTIAL/PLATELET
Basophils Absolute: 0 10*3/uL (ref 0.0–0.1)
Basophils Relative: 0.8 % (ref 0.0–3.0)
Eosinophils Absolute: 0.1 10*3/uL (ref 0.0–0.7)
Eosinophils Relative: 1.4 % (ref 0.0–5.0)
HCT: 37.2 % (ref 36.0–46.0)
Hemoglobin: 12.6 g/dL (ref 12.0–15.0)
Lymphocytes Relative: 32.1 % (ref 12.0–46.0)
Lymphs Abs: 1.9 10*3/uL (ref 0.7–4.0)
MCHC: 33.9 g/dL (ref 30.0–36.0)
MCV: 91.9 fl (ref 78.0–100.0)
Monocytes Absolute: 0.4 10*3/uL (ref 0.1–1.0)
Monocytes Relative: 6.6 % (ref 3.0–12.0)
Neutro Abs: 3.5 10*3/uL (ref 1.4–7.7)
Neutrophils Relative %: 59.1 % (ref 43.0–77.0)
Platelets: 332 10*3/uL (ref 150.0–400.0)
RBC: 4.05 Mil/uL (ref 3.87–5.11)
RDW: 12.3 % (ref 11.5–15.5)
WBC: 5.9 10*3/uL (ref 4.0–10.5)

## 2020-09-25 LAB — IRON: Iron: 133 ug/dL (ref 42–145)

## 2020-09-25 LAB — FERRITIN: Ferritin: 104.3 ng/mL (ref 10.0–291.0)

## 2020-09-25 NOTE — Patient Instructions (Signed)
[employer] medicine (25th ed., pp. 1059-1068). [place], [place]: Elsevier.">  Anemia  Anemia is a condition in which there is not enough red blood cells or hemoglobin in the blood. Hemoglobin is a substance in red blood cells thatcarries oxygen. When you do not have enough red blood cells or hemoglobin (are anemic), your body cannot get enough oxygen and your organs may not work properly. Asa result, you may feel very tired or have other problems. What are the causes? Common causes of anemia include: Excessive bleeding. Anemia can be caused by excessive bleeding inside or outside the body, including bleeding from the intestines or from heavy menstrual periods in females. Poor nutrition. Long-lasting (chronic) kidney, thyroid, and liver disease. Bone marrow disorders, spleen problems, and blood disorders. Cancer and treatments for cancer. HIV (human immunodeficiency virus) and AIDS (acquired immunodeficiency syndrome). Infections, medicines, and autoimmune disorders that destroy red blood cells. What are the signs or symptoms? Symptoms of this condition include: Minor weakness. Dizziness. Headache, or difficulties concentrating and sleeping. Heartbeats that feel irregular or faster than normal (palpitations). Shortness of breath, especially with exercise. Pale skin, lips, and nails, or cold hands and feet. Indigestion and nausea. Symptoms may occur suddenly or develop slowly. If your anemia is mild, you maynot have symptoms. How is this diagnosed? This condition is diagnosed based on blood tests, your medical history, and a physical exam. In some cases, a test may be needed in which cells are removed from the soft tissue inside of a bone and looked at under a microscope (bone marrow biopsy). Your health care provider may also check your stool (feces) for blood and may do additional testing to look for the cause of yourbleeding. Other tests may include: Imaging tests, such as a CT scan or  MRI. A procedure to see inside your esophagus and stomach (endoscopy). A procedure to see inside your colon and rectum (colonoscopy). How is this treated? Treatment for this condition depends on the cause. If you continue to lose a lot of blood, you may need to be treated at a hospital. Treatment may include: Taking supplements of iron, vitamin B12, or folic acid. Taking a hormone medicine (erythropoietin) that can help to stimulate red blood cell growth. Having a blood transfusion. This may be needed if you lose a lot of blood. Making changes to your diet. Having surgery to remove your spleen. Follow these instructions at home: Take over-the-counter and prescription medicines only as told by your health care provider. Take supplements only as told by your health care provider. Follow any diet instructions that you were given by your health care provider. Keep all follow-up visits as told by your health care provider. This is important. Contact a health care provider if: You develop new bleeding anywhere in the body. Get help right away if: You are very weak. You are short of breath. You have pain in your abdomen or chest. You are dizzy or feel faint. You have trouble concentrating. You have bloody stools, black stools, or tarry stools. You vomit repeatedly or you vomit up blood. These symptoms may represent a serious problem that is an emergency. Do not wait to see if the symptoms will go away. Get medical help right away. Call your local emergency services (911 in the [place] Do not drive yourself to the hospital. Summary Anemia is a condition in which you do not have enough red blood cells or enough of a substance in your red blood cells that carries oxygen (hemoglobin). Symptoms may occur suddenly   or develop slowly. If your anemia is mild, you may not have symptoms. This condition is diagnosed with blood tests, a medical history, and a physical exam. Other tests may be  needed. Treatment for this condition depends on the cause of the anemia. This information is not intended to replace advice given to you by your health care provider. Make sure you discuss any questions you have with your healthcare provider. Document Revised: 01/26/2019 Document Reviewed: 01/26/2019 Elsevier Patient Education  2022 [employer].

## 2020-09-25 NOTE — Procedures (Signed)
     [facility]  HOME SLEEP TEST (Watch PAT) REPORT  STUDY DATE: 09/18/2020  DOB: Aug 10, 1990  MRN: [id number]  ORDERING CLINICIAN: Huston Foley, MD, PhD   REFERRING CLINICIAN:  Etta Grandchild, MD  CLINICAL INFORMATION/HISTORY: 30 year old right-handed woman with an underlying medical history of migraine headaches, anxiety, and overweight state, who reports snoring, daytime tiredness and nonrestorative sleep.    Epworth sleepiness score: 5/24.  BMI: 28.5 kg/m  FINDINGS:   Sleep Summary:   Total Recording Time (hours, min): 8 hours, 43 minutes  Total Sleep Time (hours, min):  6 hours, 57 minutes   Percent REM (%):    14.6%   Respiratory Indices:   Calculated pAHI (per hour):  3/hour         REM pAHI:    3/hour       NREM pAHI: 3/hour  Oxygen Saturation Statistics:    Oxygen Saturation (%) Mean: 96%   Minimum oxygen saturation (%):                 91%   O2 Saturation Range (%): 91-98%    O2 Saturation (minutes) <=88%: 0 min  Pulse Rate Statistics:   Pulse Mean (bpm):    64/min    Pulse Range (49-108/min)   IMPRESSION: Primary snoring   RECOMMENDATION:  This home sleep test does not demonstrate any significant obstructive or central sleep disordered breathing.  Snoring was detected , in the mild to moderate range, at times intermittent. Other causes of the patient's symptoms, including circadian rhythm disturbances, an underlying mood disorder, medication effect and/or an underlying medical problem cannot be ruled out based on this test. Clinical correlation is recommended. The patient should be cautioned not to drive, work at heights, or operate dangerous or heavy equipment when tired or sleepy. Review and reiteration of good sleep hygiene measures should be pursued with any patient. The patient can follow up with her referring provider, who will be notified of the test results.   I certify that I have reviewed the raw data recording prior  to the issuance of this report in accordance with the standards of the American Academy of Sleep Medicine (AASM).  INTERPRETING PHYSICIAN:   Huston Foley, MD, PhD  Board Certified in Neurology and Sleep Medicine  [facility] [address] [place], [place] [address] [phone]

## 2020-09-25 NOTE — Progress Notes (Signed)
Subjective:  Patient ID: Sandra Brewer, female    DOB: [birth year]  Age: 30 y.o. MRN: [id number]  CC: Anemia  This visit occurred during the SARS-CoV-2 public health emergency.  Safety protocols were in place, including screening questions prior to the visit, additional usage of staff PPE, and extensive cleaning of exam room while observing appropriate contact time as indicated for disinfecting solutions.    HPI Sandra Brewer presents for f/up - Her HA's are well controlled. She complains of fatigue but offers no other complaints.  Outpatient Medications Prior to Visit  Medication Sig Dispense Refill   Atogepant (QULIPTA) 60 MG TABS Take 1 tablet by mouth daily. 90 tablet 1   citalopram (CELEXA) 20 MG tablet TAKE 1 AND 1/2 TABLETS DAILY BY MOUTH 135 tablet 2   KARIVA 0.15-0.02/0.01 MG (21/5) tablet TAKE 1 TABLET BY MOUTH EVERY DAY 84 tablet 3   No facility-administered medications prior to visit.    ROS Review of Systems  Constitutional:  Positive for fatigue. Negative for appetite change, chills, diaphoresis, fever and unexpected weight change.  HENT: Negative.    Eyes: Negative.   Respiratory:  Negative for cough, chest tightness, shortness of breath and wheezing.   Cardiovascular:  Negative for chest pain, palpitations and leg swelling.  Gastrointestinal:  Negative for abdominal pain, constipation, diarrhea and vomiting.  Endocrine: Negative.   Genitourinary: Negative.  Negative for difficulty urinating.  Musculoskeletal: Negative.  Negative for arthralgias and myalgias.  Skin: Negative.   Neurological: Negative.  Negative for dizziness, weakness, light-headedness and headaches.  Hematological:  Negative for adenopathy. Does not bruise/bleed easily.  Psychiatric/Behavioral: Negative.     Objective:  BP 116/70 (BP Location: Right Arm, Patient Position: Sitting, Cuff Size: Large)   Pulse 75   Temp 98.5 F (36.9 C) (Oral)   Ht 5' (1.524 m)   Wt 144 lb (65.3 kg)   LMP 09/05/2020    SpO2 99%   BMI 28.12 kg/m   BP Readings from Last 3 Encounters:  09/25/20 116/70  08/17/20 122/82  06/26/20 116/72    Wt Readings from Last 3 Encounters:  09/25/20 144 lb (65.3 kg)  08/17/20 146 lb (66.2 kg)  06/26/20 151 lb (68.5 kg)    Physical Exam Vitals reviewed.  HENT:     Nose: Nose normal.     Mouth/Throat:     Mouth: Mucous membranes are moist.  Eyes:     Conjunctiva/sclera: Conjunctivae normal.  Cardiovascular:     Rate and Rhythm: Normal rate and regular rhythm.     Heart sounds: No murmur heard. Pulmonary:     Effort: Pulmonary effort is normal.     Breath sounds: No stridor. No wheezing, rhonchi or rales.  Abdominal:     General: Abdomen is flat.     Palpations: There is no mass.     Tenderness: There is no abdominal tenderness. There is no guarding.     Hernia: No hernia is present.  Musculoskeletal:        General: Normal range of motion.     Cervical back: Neck supple.     Right lower leg: No edema.     Left lower leg: No edema.  Lymphadenopathy:     Cervical: No cervical adenopathy.  Skin:    General: Skin is warm and dry.  Neurological:     General: No focal deficit present.     Mental Status: She is alert. Mental status is at baseline.    Lab Results  Component Value  Date   WBC 5.9 09/25/2020   HGB 12.6 09/25/2020   HCT 37.2 09/25/2020   PLT 332.0 09/25/2020   GLUCOSE 103 (H) 06/26/2020   CHOL 180 06/26/2020   TRIG 149.0 06/26/2020   HDL 48.60 06/26/2020   LDLCALC 102 (H) 06/26/2020   ALT 11 06/26/2020   AST 14 06/26/2020   NA 139 06/26/2020   K 4.3 06/26/2020   CL 103 06/26/2020   CREATININE 0.66 06/26/2020   BUN 10 06/26/2020   CO2 26 06/26/2020   TSH 1.74 06/26/2020   INR 0.95 11/18/2015    MR Brain Wo Contrast  Result Date: 07/03/2020 CLINICAL DATA:  Headaches. EXAM: MRI HEAD WITHOUT CONTRAST TECHNIQUE: Multiplanar, multiecho pulse sequences of the brain and surrounding structures were obtained without intravenous  contrast. COMPARISON:  None. FINDINGS: Brain: No acute infarction, hemorrhage, hydrocephalus, extra-axial collection or mass lesion. Mild frontal predominant T2/FLAIR hyperintensities within the white matter, mildly advanced in number for age. Vascular: Major arterial flow voids are maintained skull base. Skull and upper cervical spine: Normal marrow signal. Sinuses/Orbits: Sinuses are largely clear.  Right concha bullosa. Other: No mastoid effusions IMPRESSION: 1. No evidence of acute abnormality. 2. Mild frontal predominant T2/FLAIR hyperintensities within the white matter, mildly advanced in number for age. This finding is nonspecific but may be secondary to chronic migraines (particularly given frontal predominance), chronic microvascular ischemic disease, or less likely prior demyelination or inflammation. Electronically Signed   By: Feliberto Harts MD   On: 07/03/2020 17:56    Assessment & Plan:   Sandra Brewer was seen today for anemia.  Diagnoses and all orders for this visit:  Deficiency anemia- Her H/H and iron are normal. -     CBC with Differential/Platelet; Future -     Iron; Future -     Ferritin; Future -     Ferritin -     Iron -     CBC with Differential/Platelet  Intractable migraine without aura and without status migrainosus- Her HA's are well controlled with the CGRP antagonist.  I am having Sandra Brewer "Sandra Brewer" maintain her Sandra Brewer, and citalopram.  No orders of the defined types were placed in this encounter.    Follow-up: Return in about 6 months (around 03/28/2021).  Sanda Linger, MD

## 2020-09-27 ENCOUNTER — Telehealth: Payer: Self-pay | Admitting: *Deleted

## 2020-09-27 NOTE — Telephone Encounter (Signed)
-----   Message from Huston Foley, MD sent at 09/25/2020  6:18 PM EDT ----- Patient referred by Dr. Yetta Barre, seen by me on 08/17/2020, HST on 09/18/2020.   Please call and notify the patient that the recent home sleep test did not show any significant obstructive sleep apnea.  Some snoring was detected, in the mild-to-moderate range, at times intermittent.  Snoring can improve with some weight loss and avoiding sleeping on the back. At this juncture, she can follow-up with her primary care physician as scheduled.   Thanks,  Huston Foley, MD, PhD [facility])

## 2020-09-27 NOTE — Telephone Encounter (Signed)
Called pt and relayed the results of her HST as per below.   She verbalized understanding.

## 2020-09-29 ENCOUNTER — Encounter: Payer: Self-pay | Admitting: Internal Medicine

## 2020-09-29 NOTE — Telephone Encounter (Signed)
Pt scheduled for 8/2 @ 8.20 with Dr. Okey Dupre PCP is full

## 2020-10-03 ENCOUNTER — Ambulatory Visit (INDEPENDENT_AMBULATORY_CARE_PROVIDER_SITE_OTHER): Payer: No Typology Code available for payment source | Admitting: Internal Medicine

## 2020-10-03 ENCOUNTER — Encounter: Payer: Self-pay | Admitting: Internal Medicine

## 2020-10-03 ENCOUNTER — Other Ambulatory Visit: Payer: Self-pay

## 2020-10-03 DIAGNOSIS — M25531 Pain in right wrist: Secondary | ICD-10-CM | POA: Diagnosis not present

## 2020-10-03 MED ORDER — DICLOFENAC SODIUM 75 MG PO TBEC: 75.0000 mg | DELAYED_RELEASE_TABLET | Freq: Two times a day (BID) | ORAL | 0 refills | Status: DC

## 2020-10-03 NOTE — Assessment & Plan Note (Signed)
Rx diclofenac BID for 1-2 weeks to help with likely tendonitis.

## 2020-10-03 NOTE — Progress Notes (Signed)
   Subjective:   Patient ID: Sandra Brewer, female    DOB: [birth year], 30 y.o.   MRN: [id number]  HPI The patient is a 30 YO female coming in for right wrist pain and problem. Has taken ibuprofen for pain only not regular. Started a week ago and not improving. Has had similar in the past but only lasted 2-3 days. Using brace at home which hasn't helped.  Review of Systems  Constitutional: Negative.   HENT: Negative.    Eyes: Negative.   Respiratory:  Negative for cough, chest tightness and shortness of breath.   Cardiovascular:  Negative for chest pain, palpitations and leg swelling.  Gastrointestinal:  Negative for abdominal distention, abdominal pain, constipation, diarrhea, nausea and vomiting.  Musculoskeletal:  Positive for myalgias.  Skin: Negative.   Neurological: Negative.   Psychiatric/Behavioral: Negative.     Objective:  Physical Exam Constitutional:      Appearance: She is well-developed.  HENT:     Head: Normocephalic and atraumatic.  Cardiovascular:     Rate and Rhythm: Normal rate and regular rhythm.  Pulmonary:     Effort: Pulmonary effort is normal. No respiratory distress.     Breath sounds: Normal breath sounds. No wheezing or rales.  Abdominal:     General: Bowel sounds are normal. There is no distension.     Palpations: Abdomen is soft.     Tenderness: There is no abdominal tenderness. There is no rebound.  Musculoskeletal:        General: Tenderness present.     Cervical back: Normal range of motion.     Comments: Pain right thumb base without redness or swelling.   Skin:    General: Skin is warm and dry.  Neurological:     Mental Status: She is alert and oriented to person, place, and time.     Coordination: Coordination normal.    Vitals:   10/03/20 0816  BP: 126/80  Pulse: 70  Resp: 18  Temp: 99 F (37.2 C)  TempSrc: Oral  SpO2: 95%  Weight: 144 lb 9.6 oz (65.6 kg)  Height: 5' (1.524 m)    This visit occurred during the SARS-CoV-2 public  health emergency.  Safety protocols were in place, including screening questions prior to the visit, additional usage of staff PPE, and extensive cleaning of exam room while observing appropriate contact time as indicated for disinfecting solutions.   Assessment & Plan:

## 2020-10-03 NOTE — Patient Instructions (Signed)
We have sent in diclofenac to take 1 pill twice a day for the next 1-2 weeks.   Let us know Friday how you are doing and if no better we can call in prednisone to help.

## 2020-10-13 ENCOUNTER — Encounter: Payer: Self-pay | Admitting: Internal Medicine

## 2020-10-13 MED ORDER — PREDNISONE 20 MG PO TABS: 40.0000 mg | ORAL_TABLET | Freq: Every day | ORAL | 0 refills | Status: AC

## 2020-12-11 ENCOUNTER — Other Ambulatory Visit: Payer: Self-pay | Admitting: Internal Medicine

## 2020-12-11 DIAGNOSIS — G43019 Migraine without aura, intractable, without status migrainosus: Secondary | ICD-10-CM

## 2021-01-02 ENCOUNTER — Encounter: Payer: Self-pay | Admitting: Internal Medicine

## 2021-02-12 ENCOUNTER — Other Ambulatory Visit (HOSPITAL_COMMUNITY)
Admission: RE | Admit: 2021-02-12 | Discharge: 2021-02-12 | Disposition: A | Discharge status: Home / Self Care | Payer: No Typology Code available for payment source | Source: Ambulatory Visit | Attending: Obstetrics and Gynecology | Admitting: Obstetrics and Gynecology

## 2021-02-12 ENCOUNTER — Other Ambulatory Visit: Payer: Self-pay

## 2021-02-12 ENCOUNTER — Ambulatory Visit (INDEPENDENT_AMBULATORY_CARE_PROVIDER_SITE_OTHER): Payer: No Typology Code available for payment source | Admitting: Obstetrics and Gynecology

## 2021-02-12 ENCOUNTER — Encounter: Payer: Self-pay | Admitting: Obstetrics and Gynecology

## 2021-02-12 VITALS — BP 117/76 | HR 80 | Wt 137.1 lb

## 2021-02-12 DIAGNOSIS — Z01419 Encounter for gynecological examination (general) (routine) without abnormal findings: Secondary | ICD-10-CM | POA: Diagnosis not present

## 2021-02-12 MED ORDER — NORGESTIMATE-ETH ESTRADIOL 0.25-35 MG-MCG PO TABS: 1.0000 | ORAL_TABLET | Freq: Every day | ORAL | 11 refills | Status: DC

## 2021-02-12 NOTE — Progress Notes (Signed)
Pt presents for breakthrough bleeding on Kariva OCP.  Pt reports having cycles Aug 1-6th, Aug 29-Sept 4th, Sept 16-21st. Sept 28-Oct 3, Oct 17-23rd, Oct 26-31st.  Pt denies heavy VB.  Normal pap 01/14/2019.

## 2021-02-12 NOTE — Progress Notes (Signed)
Subjective:     Sandra Brewer is a 30 y.o. female P2 with LMP 01/24/21 and BMI 26 who is here for a comprehensive physical exam. The patient reports breakthrough bleeding while on COC. Patient denies pelvic pain or abnormal discharge. She denies urinary incontinence. She is sexually active using BTL for contraception. She is using COC for cycle control. She reports 3-4 days of vaginal spotting mid-way through her pack. She is without any other complaints  Past Medical History:  Diagnosis Date   Gall stones    Past Surgical History:  Procedure Laterality Date   CHOLECYSTECTOMY N/A 11/20/2015   Procedure: LAPAROSCOPIC CHOLECYSTECTOMY;  Surgeon: Franky Macho, MD;  Location: [facility];  Service: General;  Laterality: N/A;   LAPAROSCOPIC BILATERAL SALPINGECTOMY Bilateral 10/10/2015   Procedure: LAPAROSCOPIC BILATERAL SALPINGECTOMY;  Surgeon: Tilda Burrow, MD;  Location: [facility];  Service: Gynecology;  Laterality: Bilateral;   NO PAST SURGERIES     Family History  Problem Relation Age of Onset   Hypertension Mother    Hypertension Father    Heart disease Father    Heart attack Father     Social History   Socioeconomic History   Marital status: Married    Spouse name: Not on file   Number of children: Not on file   Years of education: Not on file   Highest education level: Not on file  Occupational History   Not on file  Tobacco Use   Smoking status: Never   Smokeless tobacco: Never  Vaping Use   Vaping Use: Never used  Substance and Sexual Activity   Alcohol use: No   Drug use: No   Sexual activity: Yes    Birth control/protection: Surgical, Pill    Comment: tubal  Other Topics Concern   Not on file  Social History Narrative   Not on file   Social Determinants of Health   Financial Resource Strain: Not on file  Food Insecurity: Not on file  Transportation Needs: Not on file  Physical Activity: Not on file  Stress: Not on file  Social Connections: Not on file  Intimate  Partner Violence: Not on file   Health Maintenance  Topic Date Due   Hepatitis C Screening  Never done   COVID-19 Vaccine (4 - Booster for Moderna series) 05/12/2020   INFLUENZA VACCINE  Never done   PAP SMEAR-Modifier  01/13/2022   TETANUS/TDAP  05/16/2024   HIV Screening  Completed   Pneumococcal Vaccine 88-42 Years old  Aged Out   HPV VACCINES  Aged Out       Review of Systems Pertinent items noted in HPI and remainder of comprehensive ROS otherwise negative.   Objective:  Blood pressure 117/76, pulse 80, weight 137 lb 1.6 oz (62.2 kg), last menstrual period 01/24/2021.   GENERAL: Well-developed, well-nourished female in no acute distress.  HEENT: Normocephalic, atraumatic. Sclerae anicteric.  NECK: Supple. Normal thyroid.  LUNGS: Clear to auscultation bilaterally.  HEART: Regular rate and rhythm. BREASTS: Symmetric in size. No palpable masses or lymphadenopathy, skin changes, or nipple drainage. ABDOMEN: Soft, nontender, nondistended. No organomegaly. PELVIC: Normal external female genitalia. Vagina is pink and rugated.  Normal discharge. Normal appearing cervix. Uterus is normal in size. No adnexal mass or tenderness. Chaperone present during the pelvic exam EXTREMITIES: No cyanosis, clubbing, or edema, 2+ distal pulses.     Assessment:    Healthy female exam.      Plan:    Pap smear collected Rx Sprintec provided Patient declined  STI screening Patient will be contacted with abnormal results See After Visit Summary for Counseling Recommendations

## 2021-02-13 LAB — CYTOLOGY - PAP: Diagnosis: NEGATIVE

## 2021-02-21 ENCOUNTER — Ambulatory Visit (INDEPENDENT_AMBULATORY_CARE_PROVIDER_SITE_OTHER): Payer: No Typology Code available for payment source | Admitting: Adult Health

## 2021-02-21 ENCOUNTER — Other Ambulatory Visit: Payer: Self-pay

## 2021-02-21 ENCOUNTER — Encounter: Payer: Self-pay | Admitting: Adult Health

## 2021-02-21 VITALS — BP 134/94 | HR 78 | Ht 60.0 in | Wt 137.0 lb

## 2021-02-21 DIAGNOSIS — R599 Enlarged lymph nodes, unspecified: Secondary | ICD-10-CM | POA: Diagnosis not present

## 2021-02-21 DIAGNOSIS — Z3202 Encounter for pregnancy test, result negative: Secondary | ICD-10-CM | POA: Diagnosis not present

## 2021-02-21 DIAGNOSIS — N921 Excessive and frequent menstruation with irregular cycle: Secondary | ICD-10-CM | POA: Diagnosis not present

## 2021-02-21 DIAGNOSIS — N946 Dysmenorrhea, unspecified: Secondary | ICD-10-CM

## 2021-02-21 DIAGNOSIS — N644 Mastodynia: Secondary | ICD-10-CM

## 2021-02-21 LAB — POCT URINE PREGNANCY: Preg Test, Ur: NEGATIVE

## 2021-02-21 MED ORDER — NORETHINDRONE 0.35 MG PO TABS: 1.0000 | ORAL_TABLET | Freq: Every day | ORAL | 11 refills | Status: DC

## 2021-02-21 NOTE — Progress Notes (Signed)
°  Subjective:     Patient ID: Sandra Brewer, female   DOB: 06-30-1990, 30 y.o.   MRN: [id number]  HPI Sandra Brewer is a 30 year old white female,married, G2P2 in complaining of irregular bleeding and breast tenderness, and lump under right arm. She has had a tubal and uses on OCs to control cramps. She had normal period in July and had had irregular bleeding every month since then. She is on [place] but was switched to ortho cyclen but has not started them,when seen last week in [place], did not feel they listened to her. She does have history of migraines with aura. PCP is Dr Yetta Barre. Lab Results  Component Value Date   DIAGPAP  02/12/2021    - Negative for intraepithelial lesion or malignancy (NILM)   HPVHIGH Negative 01/14/2019    Review of Systems +irregular bleeding +breast tenderness +lump under right arm +tired  Has lost about 20 lbs not trying is more active.  Reviewed past medical,surgical, social and family history. Reviewed medications and allergies.     Objective:   Physical Exam BP (!) 134/94 (BP Location: Left Arm, Patient Position: Sitting, Cuff Size: Normal)    Pulse 78    Ht 5' (1.524 m)    Wt 137 lb (62.1 kg)    LMP 01/24/2021    BMI 26.76 kg/m     UPT is negative.  Skin warm and dry,  Breasts:no dominate palpable mass, retraction or nipple discharge, has swollen lymph node right under arm. Pelvic: external genitalia is normal in appearance no lesions, vagina: scant red blood in vault,urethra has no lesions or masses noted, cervix:smooth and bulbous,no CMT, uterus: normal size, shape and contour, non tender, no masses felt, adnexa: no masses or tenderness noted. Bladder is non tender and no masses felt.  Fall risk is low  Upstream - 02/21/21 1542       Pregnancy Intention Screening   Does the patient want to become pregnant in the next year? No    Does the patient's partner want to become pregnant in the next year? No    Would the patient like to discuss contraceptive  options today? No      Contraception Wrap Up   Current Method Oral Contraceptive;Female Sterilization    End Method Oral Contraceptive;Female Sterilization    Contraception Counseling Provided No            Examination chaperoned by Malachy Mood LPN  Assessment:       1. Pregnancy examination or test, negative result  - POCT urine pregnancy  2. Irregular intermenstrual bleeding Will check labs  - Comprehensive metabolic panel - TSH - T4, free - CBC w/Diff Will stop COC and change to POP, has migraine with aura   3. Breast tenderness   4. Swollen lymph nodes Will recheck in 4 weeks  - CBC w/Diff  5. Menstrual cramps     Plan:     Start Micronor this weekend Follow up in 4 weeks  Will talk when labs back

## 2021-02-22 LAB — CBC WITH DIFFERENTIAL/PLATELET
Basophils Absolute: 0 10*3/uL (ref 0.0–0.2)
Basos: 1 %
EOS (ABSOLUTE): 0.2 10*3/uL (ref 0.0–0.4)
Eos: 2 %
Hematocrit: 36.1 % (ref 34.0–46.6)
Hemoglobin: 12 g/dL (ref 11.1–15.9)
Immature Grans (Abs): 0 10*3/uL (ref 0.0–0.1)
Immature Granulocytes: 0 %
Lymphocytes Absolute: 3.5 10*3/uL — ABNORMAL HIGH (ref 0.7–3.1)
Lymphs: 50 %
MCH: 30.2 pg (ref 26.6–33.0)
MCHC: 33.2 g/dL (ref 31.5–35.7)
MCV: 91 fL (ref 79–97)
Monocytes Absolute: 0.5 10*3/uL (ref 0.1–0.9)
Monocytes: 6 %
Neutrophils Absolute: 2.9 10*3/uL (ref 1.4–7.0)
Neutrophils: 41 %
Platelets: 463 10*3/uL — ABNORMAL HIGH (ref 150–450)
RBC: 3.98 x10E6/uL (ref 3.77–5.28)
RDW: 11.4 % — ABNORMAL LOW (ref 11.7–15.4)
WBC: 7 10*3/uL (ref 3.4–10.8)

## 2021-02-22 LAB — COMPREHENSIVE METABOLIC PANEL
ALT: 16 IU/L (ref 0–32)
AST: 22 IU/L (ref 0–40)
Albumin/Globulin Ratio: 1.4 (ref 1.2–2.2)
Albumin: 4.4 g/dL (ref 3.8–4.8)
Alkaline Phosphatase: 70 IU/L (ref 44–121)
BUN/Creatinine Ratio: 9 (ref 9–23)
BUN: 6 mg/dL (ref 6–20)
Bilirubin Total: <0.2 mg/dL (ref 0.0–1.2)
CO2: 27 mmol/L (ref 20–29)
Calcium: 9.5 mg/dL (ref 8.7–10.2)
Chloride: 100 mmol/L (ref 96–106)
Creatinine, Ser: 0.64 mg/dL (ref 0.57–1.00)
Globulin, Total: 3.2 g/dL (ref 1.5–4.5)
Glucose: 88 mg/dL (ref 70–99)
Potassium: 4 mmol/L (ref 3.5–5.2)
Sodium: 140 mmol/L (ref 134–144)
Total Protein: 7.6 g/dL (ref 6.0–8.5)
eGFR: 120 mL/min/{1.73_m2} (ref >59)

## 2021-02-22 LAB — TSH: TSH: 0.458 u[IU]/mL (ref 0.450–4.500)

## 2021-02-22 LAB — T4, FREE: Free T4: 1.31 ng/dL (ref 0.82–1.77)

## 2021-03-11 ENCOUNTER — Other Ambulatory Visit: Payer: Self-pay | Admitting: Obstetrics & Gynecology

## 2021-03-16 ENCOUNTER — Other Ambulatory Visit: Payer: Self-pay

## 2021-03-16 MED ORDER — METHYLPREDNISOLONE 4 MG PO TBPK: ORAL_TABLET | ORAL | 0 refills | Status: DC

## 2021-03-21 ENCOUNTER — Encounter: Payer: Self-pay | Admitting: Adult Health

## 2021-03-21 ENCOUNTER — Other Ambulatory Visit: Payer: Self-pay

## 2021-03-21 ENCOUNTER — Ambulatory Visit (INDEPENDENT_AMBULATORY_CARE_PROVIDER_SITE_OTHER): Payer: No Typology Code available for payment source | Admitting: Adult Health

## 2021-03-21 VITALS — BP 131/87 | HR 65 | Ht 60.0 in | Wt 134.8 lb

## 2021-03-21 DIAGNOSIS — N644 Mastodynia: Secondary | ICD-10-CM | POA: Diagnosis not present

## 2021-03-21 DIAGNOSIS — R599 Enlarged lymph nodes, unspecified: Secondary | ICD-10-CM

## 2021-03-21 DIAGNOSIS — N921 Excessive and frequent menstruation with irregular cycle: Secondary | ICD-10-CM | POA: Diagnosis not present

## 2021-03-21 MED ORDER — NORETHINDRONE 0.35 MG PO TABS: 1.0000 | ORAL_TABLET | Freq: Every day | ORAL | 11 refills | Status: DC

## 2021-03-21 NOTE — Progress Notes (Signed)
°  Subjective:     Patient ID: Sandra Brewer, female   DOB: 05/11/1990, 31 y.o.   MRN: [id number]  HPI Sandra Brewer is a 31 year old white female, married, G2P2 in for follow up on breat tenderness and swollen lymph node under right ar, it seems smaller,still tender.  PCP is Dr Yetta Barre.  Lab Results  Component Value Date   DIAGPAP  02/12/2021    - Negative for intraepithelial lesion or malignancy (NILM)   HPVHIGH Negative 01/14/2019    Review of Systems +breast tenderness Swollen lymph node right under arm Had 2 periods in January, on first pack of Micronor  Reviewed past medical,surgical, social and family history. Reviewed medications and allergies.     Objective:   Physical Exam BP 131/87 (BP Location: Right Arm, Patient Position: Sitting, Cuff Size: Normal)    Pulse 65    Ht 5' (1.524 m)    Wt 134 lb 12.8 oz (61.1 kg)    LMP 03/16/2021    BMI 26.33 kg/m        Skin warm and dry,  Breasts:no dominate palpable mass, retraction or nipple discharge, no lymph node felt today, but tender  Upstream - 03/21/21 0933       Pregnancy Intention Screening   Does the patient want to become pregnant in the next year? No    Does the patient's partner want to become pregnant in the next year? No    Would the patient like to discuss contraceptive options today? No      Contraception Wrap Up   Current Method Oral Contraceptive;Female Sterilization    End Method Oral Contraceptive;Female Sterilization    Contraception Counseling Provided No             Assessment:       1. Breast tenderness Diagnostic mammogram and [place] scheduled 04/05/21 at [facility] at 3:40 pm  - US BREAST LTD UNI RIGHT INC AXILLA; Future - MM DIAG BREAST TOMO BILATERAL; Future - US BREAST LTD UNI LEFT INC AXILLA; Future  2. Swollen lymph nodes  - US BREAST LTD UNI RIGHT INC AXILLA; Future      3. Irregular bleeding Continue Micronor Meds ordered this encounter  Medications   norethindrone (MICRONOR) 0.35 MG tablet     Sig: Take 1 tablet (0.35 mg total) by mouth daily.    Dispense:  28 tablet    Refill:  11    Order Specific Question:   Supervising Provider    Answer:   Lazaro Arms [[id number]]    Plan:     Follow up in 4 months for ROS

## 2021-03-28 ENCOUNTER — Ambulatory Visit: Payer: No Typology Code available for payment source | Admitting: Internal Medicine

## 2021-04-02 ENCOUNTER — Other Ambulatory Visit: Payer: Self-pay

## 2021-04-02 ENCOUNTER — Ambulatory Visit (INDEPENDENT_AMBULATORY_CARE_PROVIDER_SITE_OTHER): Payer: No Typology Code available for payment source

## 2021-04-02 ENCOUNTER — Encounter: Payer: Self-pay | Admitting: Internal Medicine

## 2021-04-02 ENCOUNTER — Ambulatory Visit (INDEPENDENT_AMBULATORY_CARE_PROVIDER_SITE_OTHER): Payer: No Typology Code available for payment source | Admitting: Internal Medicine

## 2021-04-02 VITALS — BP 116/64 | HR 81 | Temp 98.1°F | Resp 16 | Ht 60.0 in | Wt 136.0 lb

## 2021-04-02 DIAGNOSIS — D75839 Thrombocytosis, unspecified: Secondary | ICD-10-CM | POA: Diagnosis not present

## 2021-04-02 DIAGNOSIS — K2100 Gastro-esophageal reflux disease with esophagitis, without bleeding: Secondary | ICD-10-CM | POA: Diagnosis not present

## 2021-04-02 DIAGNOSIS — G8929 Other chronic pain: Secondary | ICD-10-CM | POA: Insufficient documentation

## 2021-04-02 DIAGNOSIS — R1013 Epigastric pain: Secondary | ICD-10-CM

## 2021-04-02 LAB — CBC WITH DIFFERENTIAL/PLATELET
Basophils Absolute: 0 10*3/uL (ref 0.0–0.1)
Basophils Relative: 0.7 % (ref 0.0–3.0)
Eosinophils Absolute: 0.2 10*3/uL (ref 0.0–0.7)
Eosinophils Relative: 2.4 % (ref 0.0–5.0)
HCT: 37.9 % (ref 36.0–46.0)
Hemoglobin: 12.4 g/dL (ref 12.0–15.0)
Lymphocytes Relative: 34.2 % (ref 12.0–46.0)
Lymphs Abs: 2.1 10*3/uL (ref 0.7–4.0)
MCHC: 32.7 g/dL (ref 30.0–36.0)
MCV: 91.8 fl (ref 78.0–100.0)
Monocytes Absolute: 0.5 10*3/uL (ref 0.1–1.0)
Monocytes Relative: 8.6 % (ref 3.0–12.0)
Neutro Abs: 3.4 10*3/uL (ref 1.4–7.7)
Neutrophils Relative %: 54.1 % (ref 43.0–77.0)
Platelets: 337 10*3/uL (ref 150.0–400.0)
RBC: 4.12 Mil/uL (ref 3.87–5.11)
RDW: 13 % (ref 11.5–15.5)
WBC: 6.2 10*3/uL (ref 4.0–10.5)

## 2021-04-02 LAB — IBC + FERRITIN
Ferritin: 90.3 ng/mL (ref 10.0–291.0)
Iron: 117 ug/dL (ref 42–145)
Saturation Ratios: 31 % (ref 20.0–50.0)
TIBC: 378 ug/dL (ref 250.0–450.0)
Transferrin: 270 mg/dL (ref 212.0–360.0)

## 2021-04-02 MED ORDER — ESOMEPRAZOLE MAGNESIUM 40 MG PO CPDR
40.0000 mg | DELAYED_RELEASE_CAPSULE | Freq: Every day | ORAL | 0 refills | Status: DC
Start: 2021-04-02 — End: 2021-06-25

## 2021-04-02 NOTE — Progress Notes (Signed)
Subjective:  Patient ID: Sandra Brewer, female    DOB: 1990/04/27  Age: 31 y.o. MRN: [id number]  CC: Abdominal Pain  This visit occurred during the SARS-CoV-2 public health emergency.  Safety protocols were in place, including screening questions prior to the visit, additional usage of staff PPE, and extensive cleaning of exam room while observing appropriate contact time as indicated for disinfecting solutions.    HPI Sandra Brewer presents for f/up -  She complains of a one month hx of abd pain that she describes as a tightness and pressure that radiates into the breastbone area. The pain occurs 2 times per week and lasts about 5-10 minutes. She has not taken anything for the symptoms. She denies odynophagia or dysphagia. That pain is not exacerbated by eating and is s/p cholecystectomy.  Outpatient Medications Prior to Visit  Medication Sig Dispense Refill   citalopram (CELEXA) 20 MG tablet TAKE 1 AND 1/2 TABLETS BY MOUTH DAILY 135 tablet 2   norethindrone (MICRONOR) 0.35 MG tablet Take 1 tablet (0.35 mg total) by mouth daily. 28 tablet 11   QULIPTA 60 MG TABS TAKE ONE (1) TABLET BY MOUTH DAILY. 90 tablet 1   methylPREDNISolone (MEDROL DOSEPAK) 4 MG TBPK tablet Take as directed 21 each 0   No facility-administered medications prior to visit.    ROS Review of Systems  Constitutional:  Negative for activity change, appetite change, chills, diaphoresis, fatigue, fever and unexpected weight change.  HENT:  Negative for sinus pressure, sore throat and trouble swallowing.   Respiratory:  Negative for cough, chest tightness, shortness of breath and wheezing.   Cardiovascular:  Negative for chest pain, palpitations and leg swelling.  Gastrointestinal:  Positive for abdominal pain. Negative for constipation, diarrhea, nausea and vomiting.  Endocrine: Negative.   Genitourinary: Negative.  Negative for difficulty urinating and hematuria.  Musculoskeletal: Negative.  Negative for arthralgias and  myalgias.  Skin: Negative.   Allergic/Immunologic: Negative.   Neurological: Negative.   Hematological:  Negative for adenopathy. Does not bruise/bleed easily.  Psychiatric/Behavioral: Negative.     Objective:  BP 116/64 (BP Location: Left Arm, Patient Position: Sitting, Cuff Size: Large)    Pulse 81    Temp 98.1 F (36.7 C) (Oral)    Resp 16    Ht 5' (1.524 m)    Wt 136 lb (61.7 kg)    LMP 03/27/2021 (Exact Date)    SpO2 97%    BMI 26.56 kg/m   BP Readings from Last 3 Encounters:  04/02/21 116/64  03/21/21 131/87  02/21/21 (!) 134/94    Wt Readings from Last 3 Encounters:  04/02/21 136 lb (61.7 kg)  03/21/21 134 lb 12.8 oz (61.1 kg)  02/21/21 137 lb (62.1 kg)    Physical Exam Vitals reviewed.  HENT:     Nose: Nose normal.     Mouth/Throat:     Mouth: Mucous membranes are moist.  Eyes:     General: No scleral icterus.    Conjunctiva/sclera: Conjunctivae normal.  Cardiovascular:     Rate and Rhythm: Normal rate and regular rhythm.     Heart sounds: No murmur heard.   No friction rub. No gallop.  Pulmonary:     Effort: Pulmonary effort is normal.     Breath sounds: No stridor. No wheezing, rhonchi or rales.  Abdominal:     General: Abdomen is flat. Bowel sounds are normal. There is no distension.     Palpations: There is no hepatomegaly, splenomegaly or mass.  Tenderness: There is no abdominal tenderness. There is no guarding.     Hernia: No hernia is present.  Musculoskeletal:     Cervical back: Neck supple.  Lymphadenopathy:     Cervical: No cervical adenopathy.  Skin:    General: Skin is warm.     Findings: No rash.  Neurological:     General: No focal deficit present.     Mental Status: She is alert. Mental status is at baseline.  Psychiatric:        Mood and Affect: Mood normal.        Behavior: Behavior normal.    Lab Results  Component Value Date   WBC 6.2 04/02/2021   HGB 12.4 04/02/2021   HCT 37.9 04/02/2021   PLT 337.0 04/02/2021   GLUCOSE  88 02/21/2021   CHOL 180 06/26/2020   TRIG 149.0 06/26/2020   HDL 48.60 06/26/2020   LDLCALC 102 (H) 06/26/2020   ALT 16 02/21/2021   AST 22 02/21/2021   NA 140 02/21/2021   K 4.0 02/21/2021   CL 100 02/21/2021   CREATININE 0.64 02/21/2021   BUN 6 02/21/2021   CO2 27 02/21/2021   TSH 0.458 02/21/2021   INR 0.95 11/18/2015    DG ABD ACUTE 2+V W 1V CHEST  Result Date: 04/02/2021 CLINICAL DATA:  Epigastric pain for 1 month, initial encounter EXAM: DG ABDOMEN ACUTE WITH 1 VIEW CHEST COMPARISON:  None. FINDINGS: Cardiac shadow is within normal limits. The lungs are well aerated bilaterally. No focal infiltrate or sizable effusion is seen. No bony abnormality is noted. Scattered large and small bowel gas is noted. Changes of prior cholecystectomy are seen. No mass or abnormal calcifications are seen. No bony abnormality is noted. IMPRESSION: No acute abnormality seen. Electronically Signed   By: Inez Catalina M.D.   On: 04/02/2021 22:20     Assessment & Plan:   Sandra Brewer was seen today for abdominal pain.  Diagnoses and all orders for this visit:  Abdominal pain, chronic, epigastric- She has a paucity of other symptoms.  Her plain x-ray and labs are reassuring.  I will treat for GERD. -     DG ABD ACUTE 2+V W 1V CHEST; Future  Gastroesophageal reflux disease with esophagitis without hemorrhage -     esomeprazole (NEXIUM) 40 MG capsule; Take 1 capsule (40 mg total) by mouth daily.  Thrombocytosis- Her platelet count is normal now. -     IBC + Ferritin; Future -     Lactate dehydrogenase; Future -     CBC with Differential/Platelet; Future -     Protein electrophoresis, serum; Future -     Protein electrophoresis, serum -     CBC with Differential/Platelet -     Lactate dehydrogenase -     IBC + Ferritin   I have discontinued Sandra Brewer "Sandra Brewer"'s methylPREDNISolone. I am also having her start on esomeprazole. Additionally, I am having her maintain her Qulipta, citalopram, and  norethindrone.  Meds ordered this encounter  Medications   esomeprazole (NEXIUM) 40 MG capsule    Sig: Take 1 capsule (40 mg total) by mouth daily.    Dispense:  90 capsule    Refill:  0     Follow-up: Return in about 6 weeks (around 05/14/2021).  Scarlette Calico, MD

## 2021-04-02 NOTE — Patient Instructions (Signed)

## 2021-04-03 ENCOUNTER — Encounter: Payer: Self-pay | Admitting: Internal Medicine

## 2021-04-04 LAB — PROTEIN ELECTROPHORESIS, SERUM
Albumin ELP: 4.5 g/dL (ref 3.8–4.8)
Alpha 1: 0.3 g/dL (ref 0.2–0.3)
Alpha 2: 0.7 g/dL (ref 0.5–0.9)
Beta 2: 0.4 g/dL (ref 0.2–0.5)
Beta Globulin: 0.4 g/dL (ref 0.4–0.6)
Gamma Globulin: 1.2 g/dL (ref 0.8–1.7)
Total Protein: 7.5 g/dL (ref 6.1–8.1)

## 2021-04-04 LAB — LACTATE DEHYDROGENASE: LDH: 119 U/L (ref 100–200)

## 2021-04-05 ENCOUNTER — Ambulatory Visit (HOSPITAL_COMMUNITY)
Admission: RE | Admit: 2021-04-05 | Discharge: 2021-04-05 | Disposition: A | Discharge status: Home / Self Care | Payer: No Typology Code available for payment source | Source: Ambulatory Visit | Attending: Adult Health | Admitting: Adult Health

## 2021-04-05 ENCOUNTER — Other Ambulatory Visit: Payer: Self-pay

## 2021-04-05 DIAGNOSIS — N644 Mastodynia: Secondary | ICD-10-CM

## 2021-04-05 DIAGNOSIS — R599 Enlarged lymph nodes, unspecified: Secondary | ICD-10-CM | POA: Diagnosis present

## 2021-04-17 ENCOUNTER — Encounter (HOSPITAL_COMMUNITY): Payer: No Typology Code available for payment source

## 2021-05-02 ENCOUNTER — Other Ambulatory Visit: Payer: Self-pay

## 2021-05-02 MED ORDER — MELOXICAM 7.5 MG PO TABS: 15.0000 mg | ORAL_TABLET | Freq: Every day | ORAL | 1 refills | Status: DC

## 2021-05-07 ENCOUNTER — Encounter: Payer: Self-pay | Admitting: Internal Medicine

## 2021-05-14 ENCOUNTER — Other Ambulatory Visit: Payer: Self-pay

## 2021-05-14 ENCOUNTER — Encounter: Payer: Self-pay | Admitting: Internal Medicine

## 2021-05-14 ENCOUNTER — Ambulatory Visit (INDEPENDENT_AMBULATORY_CARE_PROVIDER_SITE_OTHER): Payer: No Typology Code available for payment source | Admitting: Internal Medicine

## 2021-05-14 ENCOUNTER — Ambulatory Visit (INDEPENDENT_AMBULATORY_CARE_PROVIDER_SITE_OTHER): Payer: No Typology Code available for payment source

## 2021-05-14 VITALS — BP 122/80 | HR 65 | Temp 98.4°F | Resp 16 | Ht 60.0 in | Wt 141.8 lb

## 2021-05-14 DIAGNOSIS — R1013 Epigastric pain: Secondary | ICD-10-CM | POA: Diagnosis not present

## 2021-05-14 DIAGNOSIS — G8929 Other chronic pain: Secondary | ICD-10-CM | POA: Diagnosis not present

## 2021-05-14 DIAGNOSIS — D539 Nutritional anemia, unspecified: Secondary | ICD-10-CM | POA: Diagnosis not present

## 2021-05-14 LAB — URINALYSIS, ROUTINE W REFLEX MICROSCOPIC
Bilirubin Urine: NEGATIVE
Ketones, ur: NEGATIVE
Leukocytes,Ua: NEGATIVE
Nitrite: NEGATIVE
Specific Gravity, Urine: <=1.005 — AB (ref 1.000–1.030)
Total Protein, Urine: NEGATIVE
Urine Glucose: NEGATIVE
Urobilinogen, UA: 0.2 (ref 0.0–1.0)
pH: 6 (ref 5.0–8.0)

## 2021-05-14 LAB — CBC WITH DIFFERENTIAL/PLATELET
Basophils Absolute: 0.1 10*3/uL (ref 0.0–0.1)
Basophils Relative: 0.8 % (ref 0.0–3.0)
Eosinophils Absolute: 0.1 10*3/uL (ref 0.0–0.7)
Eosinophils Relative: 2.1 % (ref 0.0–5.0)
HCT: 35.2 % — ABNORMAL LOW (ref 36.0–46.0)
Hemoglobin: 11.8 g/dL — ABNORMAL LOW (ref 12.0–15.0)
Lymphocytes Relative: 32.6 % (ref 12.0–46.0)
Lymphs Abs: 2.1 10*3/uL (ref 0.7–4.0)
MCHC: 33.5 g/dL (ref 30.0–36.0)
MCV: 93 fl (ref 78.0–100.0)
Monocytes Absolute: 0.5 10*3/uL (ref 0.1–1.0)
Monocytes Relative: 7.4 % (ref 3.0–12.0)
Neutro Abs: 3.7 10*3/uL (ref 1.4–7.7)
Neutrophils Relative %: 57.1 % (ref 43.0–77.0)
Platelets: 297 10*3/uL (ref 150.0–400.0)
RBC: 3.79 Mil/uL — ABNORMAL LOW (ref 3.87–5.11)
RDW: 13.3 % (ref 11.5–15.5)
WBC: 6.6 10*3/uL (ref 4.0–10.5)

## 2021-05-14 LAB — BASIC METABOLIC PANEL
BUN: 11 mg/dL (ref 6–23)
CO2: 28 mEq/L (ref 19–32)
Calcium: 9.1 mg/dL (ref 8.4–10.5)
Chloride: 103 mEq/L (ref 96–112)
Creatinine, Ser: 0.59 mg/dL (ref 0.40–1.20)
GFR: 119.03 mL/min (ref >60.00)
Glucose, Bld: 90 mg/dL (ref 70–99)
Potassium: 3.7 mEq/L (ref 3.5–5.1)
Sodium: 137 mEq/L (ref 135–145)

## 2021-05-14 LAB — HEPATIC FUNCTION PANEL
ALT: 17 U/L (ref 0–35)
AST: 18 U/L (ref 0–37)
Albumin: 4.4 g/dL (ref 3.5–5.2)
Alkaline Phosphatase: 47 U/L (ref 39–117)
Bilirubin, Direct: 0 mg/dL (ref 0.0–0.3)
Total Bilirubin: 0.2 mg/dL (ref 0.2–1.2)
Total Protein: 7.1 g/dL (ref 6.0–8.3)

## 2021-05-14 LAB — AMYLASE: Amylase: 21 U/L — ABNORMAL LOW (ref 27–131)

## 2021-05-14 LAB — LIPASE: Lipase: 19 U/L (ref 11.0–59.0)

## 2021-05-14 NOTE — Patient Instructions (Signed)

## 2021-05-14 NOTE — Progress Notes (Signed)
Subjective:  Patient ID: Sandra Brewer, female    DOB: 1990-09-23  Age: 31 y.o. MRN: [id number]  CC: Abdominal Pain (Started a few months ago, nexium not working. )  This visit occurred during the SARS-CoV-2 public health emergency.  Safety protocols were in place, including screening questions prior to the visit, additional usage of staff PPE, and extensive cleaning of exam room while observing appropriate contact time as indicated for disinfecting solutions.    HPI Jardyn Ealy presents for f/up -  She continues to complain of epigastric abdominal pain.  She describes an intermittent tight, sharp sensation that lasts for about 5 or 10 minutes and then resolves.  She is not aware of any triggers.  She had a brief episode of nausea, vomiting, diarrhea 5 days ago but on that day a stomach flu was moving to a family.  She has had no nausea, vomiting, or diarrhea for the last 4 days.  She says the pain radiates up into her chest and into her back around her shoulder blades and feels like a pins-and-needles sensation.  She denies loss of appetite, weight loss, odynophagia, or dysphagia.  She tells me that the PPI has not helped her symptoms.  Outpatient Medications Prior to Visit  Medication Sig Dispense Refill   citalopram (CELEXA) 20 MG tablet TAKE 1 AND 1/2 TABLETS BY MOUTH DAILY 135 tablet 2   esomeprazole (NEXIUM) 40 MG capsule Take 1 capsule (40 mg total) by mouth daily. 90 capsule 0   meloxicam (MOBIC) 7.5 MG tablet Take 2 tablets (15 mg total) by mouth daily. 30 tablet 1   norethindrone (MICRONOR) 0.35 MG tablet Take 1 tablet (0.35 mg total) by mouth daily. 28 tablet 11   QULIPTA 60 MG TABS TAKE ONE (1) TABLET BY MOUTH DAILY. 90 tablet 1   No facility-administered medications prior to visit.    ROS Review of Systems  Constitutional:  Positive for unexpected weight change (wt gain). Negative for chills, diaphoresis and fatigue.  HENT: Negative.  Negative for trouble swallowing and voice  change.   Eyes: Negative.   Respiratory:  Negative for cough, shortness of breath and wheezing.   Cardiovascular:  Negative for chest pain, palpitations and leg swelling.  Gastrointestinal:  Positive for abdominal pain. Negative for blood in stool, constipation, diarrhea, nausea and vomiting.  Endocrine: Negative.   Genitourinary: Negative.  Negative for decreased urine volume, difficulty urinating, dysuria, hematuria and urgency.  Musculoskeletal: Negative.   Skin: Negative.   Neurological:  Negative for dizziness, weakness, light-headedness and headaches.  Hematological:  Negative for adenopathy. Does not bruise/bleed easily.  Psychiatric/Behavioral: Negative.     Objective:  BP 122/80   Pulse 65   Temp 98.4 F (36.9 C) (Oral)   Resp 16   Ht 5' (1.524 m)   Wt 141 lb 12.8 oz (64.3 kg)   LMP 04/14/2021 (Exact Date) Comment: Hx of tubal ligation  SpO2 99%   BMI 27.69 kg/m   BP Readings from Last 3 Encounters:  05/14/21 122/80  04/02/21 116/64  03/21/21 131/87    Wt Readings from Last 3 Encounters:  05/14/21 141 lb 12.8 oz (64.3 kg)  04/02/21 136 lb (61.7 kg)  03/21/21 134 lb 12.8 oz (61.1 kg)    Physical Exam Constitutional:      Appearance: She is not ill-appearing.  HENT:     Nose: Nose normal.     Mouth/Throat:     Mouth: Mucous membranes are moist.  Eyes:     General:  No scleral icterus.    Conjunctiva/sclera: Conjunctivae normal.  Cardiovascular:     Rate and Rhythm: Normal rate and regular rhythm.     Heart sounds: No murmur heard. Pulmonary:     Effort: Pulmonary effort is normal.     Breath sounds: No stridor. No wheezing, rhonchi or rales.  Abdominal:     General: Abdomen is flat. Bowel sounds are normal. There is no distension.     Palpations: Abdomen is soft. There is no hepatomegaly, splenomegaly or mass.     Tenderness: There is no abdominal tenderness. There is no guarding or rebound.     Hernia: No hernia is present.  Musculoskeletal:         General: Normal range of motion.     Cervical back: Neck supple.     Right lower leg: No edema.     Left lower leg: No edema.  Lymphadenopathy:     Cervical: No cervical adenopathy.  Skin:    General: Skin is warm and dry.  Neurological:     General: No focal deficit present.     Mental Status: She is alert.    Lab Results  Component Value Date   WBC 6.6 05/14/2021   HGB 11.8 (L) 05/14/2021   HCT 35.2 (L) 05/14/2021   PLT 297.0 05/14/2021   GLUCOSE 90 05/14/2021   CHOL 180 06/26/2020   TRIG 149.0 06/26/2020   HDL 48.60 06/26/2020   LDLCALC 102 (H) 06/26/2020   ALT 17 05/14/2021   AST 18 05/14/2021   NA 137 05/14/2021   K 3.7 05/14/2021   CL 103 05/14/2021   CREATININE 0.59 05/14/2021   BUN 11 05/14/2021   CO2 28 05/14/2021   TSH 0.458 02/21/2021   INR 0.95 11/18/2015    US BREAST LTD UNI LEFT INC AXILLA  Result Date: 04/05/2021 CLINICAL DATA:  31 year old female presenting for evaluation of bilateral focal breast pain. A palpable lump was also found in the right axilla on clinical breast exam, but appears to have resolved. She has family history of breast cancer in a paternal aunt who was in her 63s. EXAM: DIGITAL DIAGNOSTIC BILATERAL MAMMOGRAM WITH TOMOSYNTHESIS AND CAD; ULTRASOUND RIGHT BREAST LIMITED; ULTRASOUND LEFT BREAST LIMITED TECHNIQUE: Bilateral digital diagnostic mammography and breast tomosynthesis was performed. The images were evaluated with computer-aided detection.; Targeted ultrasound examination of the right breast was performed; Targeted ultrasound examination of the left breast was performed. COMPARISON:  None. ACR Breast Density Category c: The breast tissue is heterogeneously dense, which may obscure small masses. FINDINGS: Spot compression tomosynthesis images through the upper outer quadrant of the right breast demonstrates an asymmetry in the axillary tail. There is asymmetric breast tissue in the upper outer posterior left breast. No definite underlying  masses are identified. No other suspicious calcifications, masses or areas of distortion are seen in the bilateral breasts. Physical exam of the upper-outer quadrant of right breast demonstrates fibroglandular tissue. No discrete palpable masses are identified. Ultrasound targeted to the upper-outer quadrant the right breast demonstrates normal fibroglandular tissue. No suspicious masses or abnormal lymph nodes are found in the right axilla. Ultrasound targeted to the upper-outer quadrant of the left breast demonstrates normal fibroglandular tissue. No suspicious masses or areas of shadowing are identified. IMPRESSION: 1. There are no suspicious mammographic findings to correspond with the patient's nonfocal breast pain. 2. No suspicious calcifications, masses or areas of distortion are seen in the bilateral breasts. RECOMMENDATION: 1. Clinical follow-up recommended for the palpable area of concern in  the right axilla. Any further workup should be based on clinical grounds. 2. Breast pain is a common condition, which will often resolve on its own without intervention. It can be affected by hormonal changes, medication side effect, weight changes and fit of the bra. Pain may also be referred from other adjacent areas of the body. Breast pain may be improved by wearing adequate well-fitting support, over-the-counter topical and oral NSAID medication, low-fat diet, and ice/heat as needed. Studies have shown an improvement in cyclic pain with use of evening primrose oil and vitamin E. Clinical follow-up recommended to discuss any further work-up recommendations and appropriate treatment. 3. Screening mammogram at age 77 unless there are persistent or intervening clinical concerns. (Code:SM-B-40A) I have discussed the findings and recommendations with the patient. If applicable, a reminder letter will be sent to the patient regarding the next appointment. BI-RADS CATEGORY  1: Negative. Electronically Signed   By:  Frederico Hamman M.D.   On: 04/05/2021 16:06   US BREAST LTD UNI RIGHT INC AXILLA  Result Date: 04/05/2021 CLINICAL DATA:  31 year old female presenting for evaluation of bilateral focal breast pain. A palpable lump was also found in the right axilla on clinical breast exam, but appears to have resolved. She has family history of breast cancer in a paternal aunt who was in her 97s. EXAM: DIGITAL DIAGNOSTIC BILATERAL MAMMOGRAM WITH TOMOSYNTHESIS AND CAD; ULTRASOUND RIGHT BREAST LIMITED; ULTRASOUND LEFT BREAST LIMITED TECHNIQUE: Bilateral digital diagnostic mammography and breast tomosynthesis was performed. The images were evaluated with computer-aided detection.; Targeted ultrasound examination of the right breast was performed; Targeted ultrasound examination of the left breast was performed. COMPARISON:  None. ACR Breast Density Category c: The breast tissue is heterogeneously dense, which may obscure small masses. FINDINGS: Spot compression tomosynthesis images through the upper outer quadrant of the right breast demonstrates an asymmetry in the axillary tail. There is asymmetric breast tissue in the upper outer posterior left breast. No definite underlying masses are identified. No other suspicious calcifications, masses or areas of distortion are seen in the bilateral breasts. Physical exam of the upper-outer quadrant of right breast demonstrates fibroglandular tissue. No discrete palpable masses are identified. Ultrasound targeted to the upper-outer quadrant the right breast demonstrates normal fibroglandular tissue. No suspicious masses or abnormal lymph nodes are found in the right axilla. Ultrasound targeted to the upper-outer quadrant of the left breast demonstrates normal fibroglandular tissue. No suspicious masses or areas of shadowing are identified. IMPRESSION: 1. There are no suspicious mammographic findings to correspond with the patient's nonfocal breast pain. 2. No suspicious calcifications,  masses or areas of distortion are seen in the bilateral breasts. RECOMMENDATION: 1. Clinical follow-up recommended for the palpable area of concern in the right axilla. Any further workup should be based on clinical grounds. 2. Breast pain is a common condition, which will often resolve on its own without intervention. It can be affected by hormonal changes, medication side effect, weight changes and fit of the bra. Pain may also be referred from other adjacent areas of the body. Breast pain may be improved by wearing adequate well-fitting support, over-the-counter topical and oral NSAID medication, low-fat diet, and ice/heat as needed. Studies have shown an improvement in cyclic pain with use of evening primrose oil and vitamin E. Clinical follow-up recommended to discuss any further work-up recommendations and appropriate treatment. 3. Screening mammogram at age 42 unless there are persistent or intervening clinical concerns. (Code:SM-B-40A) I have discussed the findings and recommendations with the patient. If applicable,  a reminder letter will be sent to the patient regarding the next appointment. BI-RADS CATEGORY  1: Negative. Electronically Signed   By: Frederico Hamman M.D.   On: 04/05/2021 16:06  MM DIAG BREAST TOMO BILATERAL  Result Date: 04/05/2021 CLINICAL DATA:  31 year old female presenting for evaluation of bilateral focal breast pain. A palpable lump was also found in the right axilla on clinical breast exam, but appears to have resolved. She has family history of breast cancer in a paternal aunt who was in her 35s. EXAM: DIGITAL DIAGNOSTIC BILATERAL MAMMOGRAM WITH TOMOSYNTHESIS AND CAD; ULTRASOUND RIGHT BREAST LIMITED; ULTRASOUND LEFT BREAST LIMITED TECHNIQUE: Bilateral digital diagnostic mammography and breast tomosynthesis was performed. The images were evaluated with computer-aided detection.; Targeted ultrasound examination of the right breast was performed; Targeted ultrasound examination of  the left breast was performed. COMPARISON:  None. ACR Breast Density Category c: The breast tissue is heterogeneously dense, which may obscure small masses. FINDINGS: Spot compression tomosynthesis images through the upper outer quadrant of the right breast demonstrates an asymmetry in the axillary tail. There is asymmetric breast tissue in the upper outer posterior left breast. No definite underlying masses are identified. No other suspicious calcifications, masses or areas of distortion are seen in the bilateral breasts. Physical exam of the upper-outer quadrant of right breast demonstrates fibroglandular tissue. No discrete palpable masses are identified. Ultrasound targeted to the upper-outer quadrant the right breast demonstrates normal fibroglandular tissue. No suspicious masses or abnormal lymph nodes are found in the right axilla. Ultrasound targeted to the upper-outer quadrant of the left breast demonstrates normal fibroglandular tissue. No suspicious masses or areas of shadowing are identified. IMPRESSION: 1. There are no suspicious mammographic findings to correspond with the patient's nonfocal breast pain. 2. No suspicious calcifications, masses or areas of distortion are seen in the bilateral breasts. RECOMMENDATION: 1. Clinical follow-up recommended for the palpable area of concern in the right axilla. Any further workup should be based on clinical grounds. 2. Breast pain is a common condition, which will often resolve on its own without intervention. It can be affected by hormonal changes, medication side effect, weight changes and fit of the bra. Pain may also be referred from other adjacent areas of the body. Breast pain may be improved by wearing adequate well-fitting support, over-the-counter topical and oral NSAID medication, low-fat diet, and ice/heat as needed. Studies have shown an improvement in cyclic pain with use of evening primrose oil and vitamin E. Clinical follow-up recommended to  discuss any further work-up recommendations and appropriate treatment. 3. Screening mammogram at age 2 unless there are persistent or intervening clinical concerns. (Code:SM-B-40A) I have discussed the findings and recommendations with the patient. If applicable, a reminder letter will be sent to the patient regarding the next appointment. BI-RADS CATEGORY  1: Negative. Electronically Signed   By: Frederico Hamman M.D.   On: 04/05/2021 16:06   DG ABD ACUTE 2+V W 1V CHEST  Result Date: 05/15/2021 CLINICAL DATA:  Epigastric abdominal pain for 4 months. EXAM: DG ABDOMEN ACUTE WITH 1 VIEW CHEST COMPARISON:  04/02/2021 FINDINGS: Normal heart size and pulmonary vascularity. No focal airspace disease or consolidation in the lungs. No blunting of costophrenic angles. No pneumothorax. Mediastinal contours appear intact. Scattered gas and stool in the colon. No small or large bowel distention. No free intra-abdominal air. No abnormal air-fluid levels. No radiopaque stones. Visualized bones appear intact. Surgical clips in the right upper quadrant. IMPRESSION: Negative abdominal radiographs.  No acute cardiopulmonary disease. Electronically Signed  By: Burman Nieves M.D.   On: 05/15/2021 02:14     Assessment & Plan:   Bionka was seen today for abdominal pain.  Diagnoses and all orders for this visit:  Abdominal pain, chronic, epigastric- Her exam and xray are reassuring but she has developed anemia - Will get a CT abd/pelvis to screen for sources of blood loss, mass, malignancy. -     Basic metabolic panel; Future -     Lipase; Future -     Amylase; Future -     Urinalysis, Routine w reflex microscopic; Future -     Hepatic function panel; Future -     CBC with Differential/Platelet; Future -     DG ABD ACUTE 2+V W 1V CHEST; Future -     CBC with Differential/Platelet -     Hepatic function panel -     Urinalysis, Routine w reflex microscopic -     Amylase -     Lipase -     Basic metabolic  panel -     CT Abdomen Pelvis W Contrast; Future  Deficiency anemia- Will screen for vitamin deficiencies. -     Vitamin B12; Future -     IBC + Ferritin; Future -     Folate; Future   I am having Tache Cutrone "Tru Sainz" maintain her Qulipta, citalopram, norethindrone, esomeprazole, and meloxicam.  No orders of the defined types were placed in this encounter.    Follow-up: Return in about 4 weeks (around 06/11/2021).  Sanda Linger, MD

## 2021-05-15 ENCOUNTER — Encounter: Payer: Self-pay | Admitting: Internal Medicine

## 2021-05-15 DIAGNOSIS — D539 Nutritional anemia, unspecified: Secondary | ICD-10-CM | POA: Insufficient documentation

## 2021-05-16 ENCOUNTER — Other Ambulatory Visit: Payer: Self-pay

## 2021-05-16 ENCOUNTER — Other Ambulatory Visit (INDEPENDENT_AMBULATORY_CARE_PROVIDER_SITE_OTHER): Payer: No Typology Code available for payment source

## 2021-05-16 DIAGNOSIS — D539 Nutritional anemia, unspecified: Secondary | ICD-10-CM

## 2021-05-16 LAB — IBC + FERRITIN
Ferritin: 32.7 ng/mL (ref 10.0–291.0)
Iron: 108 ug/dL (ref 42–145)
Saturation Ratios: 31.4 % (ref 20.0–50.0)
TIBC: 344.4 ug/dL (ref 250.0–450.0)
Transferrin: 246 mg/dL (ref 212.0–360.0)

## 2021-05-16 LAB — VITAMIN B12: Vitamin B-12: 381 pg/mL (ref 211–911)

## 2021-05-16 LAB — FOLATE: Folate: 9.9 ng/mL (ref >5.9)

## 2021-05-24 ENCOUNTER — Other Ambulatory Visit: Payer: Self-pay | Admitting: Internal Medicine

## 2021-05-24 DIAGNOSIS — G8929 Other chronic pain: Secondary | ICD-10-CM

## 2021-05-28 ENCOUNTER — Ambulatory Visit: Payer: No Typology Code available for payment source | Admitting: Internal Medicine

## 2021-06-04 ENCOUNTER — Ambulatory Visit: Payer: No Typology Code available for payment source | Admitting: Internal Medicine

## 2021-06-06 ENCOUNTER — Encounter: Payer: Self-pay | Admitting: Internal Medicine

## 2021-06-06 ENCOUNTER — Ambulatory Visit: Payer: No Typology Code available for payment source | Admitting: Internal Medicine

## 2021-06-15 ENCOUNTER — Other Ambulatory Visit: Payer: Self-pay | Admitting: Internal Medicine

## 2021-06-15 DIAGNOSIS — G43019 Migraine without aura, intractable, without status migrainosus: Secondary | ICD-10-CM

## 2021-06-18 ENCOUNTER — Ambulatory Visit
Admission: RE | Admit: 2021-06-18 | Discharge: 2021-06-18 | Disposition: A | Discharge status: Home / Self Care | Payer: No Typology Code available for payment source | Source: Ambulatory Visit | Attending: Internal Medicine | Admitting: Internal Medicine

## 2021-06-18 DIAGNOSIS — G8929 Other chronic pain: Secondary | ICD-10-CM

## 2021-06-18 DIAGNOSIS — R100 Acute abdomen: Secondary | ICD-10-CM | POA: Diagnosis not present

## 2021-06-18 DIAGNOSIS — D171 Benign lipomatous neoplasm of skin and subcutaneous tissue of trunk: Secondary | ICD-10-CM | POA: Diagnosis not present

## 2021-06-18 DIAGNOSIS — K7689 Other specified diseases of liver: Secondary | ICD-10-CM | POA: Diagnosis not present

## 2021-06-18 DIAGNOSIS — N83201 Unspecified ovarian cyst, right side: Secondary | ICD-10-CM | POA: Diagnosis not present

## 2021-06-18 MED ORDER — IOPAMIDOL (ISOVUE-300) INJECTION 61%
100.0000 mL | Freq: Once | INTRAVENOUS | Status: AC | PRN
  Administered 2021-06-18: 100 mL via INTRAVENOUS

## 2021-06-20 ENCOUNTER — Encounter: Payer: Self-pay | Admitting: Internal Medicine

## 2021-06-22 ENCOUNTER — Other Ambulatory Visit: Payer: Self-pay | Admitting: Internal Medicine

## 2021-06-22 DIAGNOSIS — K5901 Slow transit constipation: Secondary | ICD-10-CM | POA: Insufficient documentation

## 2021-06-22 MED ORDER — POLYETHYLENE GLYCOL 3350 17 G PO PACK: 17.0000 g | PACK | Freq: Every day | ORAL | 0 refills | Status: DC

## 2021-06-25 ENCOUNTER — Encounter: Payer: Self-pay | Admitting: Internal Medicine

## 2021-06-25 ENCOUNTER — Ambulatory Visit (INDEPENDENT_AMBULATORY_CARE_PROVIDER_SITE_OTHER): Payer: No Typology Code available for payment source | Admitting: Internal Medicine

## 2021-06-25 VITALS — BP 118/78 | HR 70 | Temp 98.3°F | Ht 60.0 in | Wt 133.0 lb

## 2021-06-25 DIAGNOSIS — G8929 Other chronic pain: Secondary | ICD-10-CM

## 2021-06-25 DIAGNOSIS — K582 Mixed irritable bowel syndrome: Secondary | ICD-10-CM | POA: Insufficient documentation

## 2021-06-25 DIAGNOSIS — D649 Anemia, unspecified: Secondary | ICD-10-CM

## 2021-06-25 LAB — CBC WITH DIFFERENTIAL/PLATELET
Basophils Absolute: 0.1 10*3/uL (ref 0.0–0.1)
Basophils Relative: 1 % (ref 0.0–3.0)
Eosinophils Absolute: 0.1 10*3/uL (ref 0.0–0.7)
Eosinophils Relative: 2.3 % (ref 0.0–5.0)
HCT: 36.5 % (ref 36.0–46.0)
Hemoglobin: 12.5 g/dL (ref 12.0–15.0)
Lymphocytes Relative: 31.7 % (ref 12.0–46.0)
Lymphs Abs: 1.8 10*3/uL (ref 0.7–4.0)
MCHC: 34.2 g/dL (ref 30.0–36.0)
MCV: 90.9 fl (ref 78.0–100.0)
Monocytes Absolute: 0.5 10*3/uL (ref 0.1–1.0)
Monocytes Relative: 8.1 % (ref 3.0–12.0)
Neutro Abs: 3.3 10*3/uL (ref 1.4–7.7)
Neutrophils Relative %: 56.9 % (ref 43.0–77.0)
Platelets: 346 10*3/uL (ref 150.0–400.0)
RBC: 4.01 Mil/uL (ref 3.87–5.11)
RDW: 12.2 % (ref 11.5–15.5)
WBC: 5.8 10*3/uL (ref 4.0–10.5)

## 2021-06-25 MED ORDER — HYOSCYAMINE SULFATE 0.125 MG PO TABS: 0.1250 mg | ORAL_TABLET | Freq: Four times a day (QID) | ORAL | 3 refills | Status: AC | PRN

## 2021-06-25 NOTE — Progress Notes (Signed)
? ?Subjective:  ?Patient ID: Sandra Brewer, female    DOB: 1990-07-31  Age: 31 y.o. MRN: [id number] ? ?CC: Abdominal Pain and Anemia ? ? ?HPI ?Sandra Brewer presents for f/up - ? ?She has had no abd pain for 3 days. When she has the pain she describes it as a burning/tightness in the epigastrium that occurs intermittently. Her CT scan showed stool burden but she does not c/o constipation. She took her first dose of miralax this morning. Nexium and meloxicam were not helpful. ? ?Outpatient Medications Prior to Visit  ?Medication Sig Dispense Refill  ? citalopram (CELEXA) 20 MG tablet TAKE 1 AND 1/2 TABLETS BY MOUTH DAILY 135 tablet 2  ? norethindrone (MICRONOR) 0.35 MG tablet Take 1 tablet (0.35 mg total) by mouth daily. 28 tablet 11  ? polyethylene glycol (MIRALAX) 17 g packet Take 17 g by mouth daily. 14 each 0  ? QULIPTA 60 MG TABS TAKE ONE (1) TABLET BY MOUTH DAILY. 90 tablet 1  ? esomeprazole (NEXIUM) 40 MG capsule Take 1 capsule (40 mg total) by mouth daily. 90 capsule 0  ? meloxicam (MOBIC) 7.5 MG tablet Take 2 tablets (15 mg total) by mouth daily. 30 tablet 1  ? ?No facility-administered medications prior to visit.  ? ? ?ROS ?Review of Systems  ?Constitutional:  Negative for chills, diaphoresis, fatigue and fever.  ?HENT: Negative.    ?Eyes: Negative.   ?Respiratory:  Negative for cough, chest tightness, shortness of breath and wheezing.   ?Cardiovascular:  Negative for chest pain, palpitations and leg swelling.  ?Gastrointestinal:  Negative for abdominal pain, constipation, diarrhea, nausea and vomiting.  ?Endocrine: Negative.   ?Genitourinary: Negative.  Negative for difficulty urinating.  ?Musculoskeletal: Negative.   ?Skin: Negative.  Negative for pallor.  ?Neurological:  Negative for dizziness, weakness and light-headedness.  ?Hematological:  Negative for adenopathy. Does not bruise/bleed easily.  ?Psychiatric/Behavioral:  Positive for dysphoric mood. Negative for decreased concentration, self-injury, sleep  disturbance and suicidal ideas. The patient is nervous/anxious. The patient is not hyperactive.   ?     She complains of anx/panic but wants to stay on the current dose of celexa with no additions  ? ?Objective:  ?BP 118/78 (BP Location: Left Arm, Patient Position: Sitting, Cuff Size: Large)   Pulse 70   Temp 98.3 ?F (36.8 ?C) (Oral)   Ht 5' (1.524 m)   Wt 133 lb (60.3 kg)   SpO2 99%   BMI 25.97 kg/m?  ? ?BP Readings from Last 3 Encounters:  ?06/25/21 118/78  ?05/14/21 122/80  ?04/02/21 116/64  ? ? ?Wt Readings from Last 3 Encounters:  ?06/25/21 133 lb (60.3 kg)  ?05/14/21 141 lb 12.8 oz (64.3 kg)  ?04/02/21 136 lb (61.7 kg)  ? ? ?Physical Exam ?Vitals reviewed.  ?Constitutional:   ?   Appearance: She is not ill-appearing.  ?HENT:  ?   Nose: Nose normal.  ?   Mouth/Throat:  ?   Mouth: Mucous membranes are moist.  ?Eyes:  ?   General: No scleral icterus. ?   Conjunctiva/sclera: Conjunctivae normal.  ?Cardiovascular:  ?   Rate and Rhythm: Normal rate and regular rhythm.  ?   Heart sounds: No murmur heard. ?Pulmonary:  ?   Effort: Pulmonary effort is normal.  ?   Breath sounds: No stridor. No wheezing, rhonchi or rales.  ?Abdominal:  ?   General: Abdomen is flat.  ?   Palpations: There is no mass.  ?   Tenderness: There is no abdominal tenderness.  There is no guarding or rebound.  ?   Hernia: No hernia is present.  ?Musculoskeletal:  ?   Cervical back: Neck supple.  ?Lymphadenopathy:  ?   Cervical: No cervical adenopathy.  ?Skin: ?   General: Skin is warm and dry.  ?Neurological:  ?   General: No focal deficit present.  ?   Mental Status: She is alert. Mental status is at baseline.  ?Psychiatric:     ?   Attention and Perception: Attention and perception normal.     ?   Mood and Affect: Mood is depressed. Mood is not anxious. Affect is flat.     ?   Speech: Speech normal. Speech is not rapid and pressured, delayed, slurred or tangential.     ?   Behavior: Behavior normal. Behavior is not slowed or withdrawn.  Behavior is cooperative.     ?   Thought Content: Thought content is not paranoid or delusional. Thought content does not include homicidal or suicidal ideation.  ? ? ?Lab Results  ?Component Value Date  ? WBC 5.8 06/25/2021  ? HGB 12.5 06/25/2021  ? HCT 36.5 06/25/2021  ? PLT 346.0 06/25/2021  ? GLUCOSE 90 05/14/2021  ? CHOL 180 06/26/2020  ? TRIG 149.0 06/26/2020  ? HDL 48.60 06/26/2020  ? LDLCALC 102 (H) 06/26/2020  ? ALT 17 05/14/2021  ? AST 18 05/14/2021  ? NA 137 05/14/2021  ? K 3.7 05/14/2021  ? CL 103 05/14/2021  ? CREATININE 0.59 05/14/2021  ? BUN 11 05/14/2021  ? CO2 28 05/14/2021  ? TSH 0.458 02/21/2021  ? INR 0.95 11/18/2015  ? ? ?CT Abdomen Pelvis W Contrast ? ?Result Date: 06/19/2021 ?CLINICAL DATA:  Acute abdominal pain. EXAM: CT ABDOMEN AND PELVIS WITH CONTRAST TECHNIQUE: Multidetector CT imaging of the abdomen and pelvis was performed using the standard protocol following bolus administration of intravenous contrast. RADIATION DOSE REDUCTION: This exam was performed according to the departmental dose-optimization program which includes automated exposure control, adjustment of the mA and/or kV according to patient size and/or use of iterative reconstruction technique. CONTRAST:  ISOVUE-300 IOPAMIDOL (ISOVUE-300) INJECTION 61% COMPARISON:  None. FINDINGS: Lower chest: No acute abnormality. Hepatobiliary: Gallbladder surgically absent. There is no biliary ductal dilatation. There is a rounded hypodensity in the posterior right lobe of the liver measuring 5 mm and less than 0 Hounsfield units, likely small lipoma. Liver is otherwise within normal limits. Pancreas: Unremarkable. No pancreatic ductal dilatation or surrounding inflammatory changes. Spleen: Normal in size without focal abnormality. Adrenals/Urinary Tract: Adrenal glands are unremarkable. Kidneys are normal, without renal calculi, focal lesion, or hydronephrosis. Bladder is unremarkable. Stomach/Bowel: Stomach is within normal limits.  Appendix appears normal. No evidence of bowel wall thickening, distention, or inflammatory changes. There is a large amount of stool throughout the colon. Vascular/Lymphatic: No significant vascular findings are present. No enlarged abdominal or pelvic lymph nodes. Reproductive: There is a 2.3 cm cyst in the right ovary. Left ovary and uterus are within normal limits. Other: No abdominal wall hernia or abnormality. No abdominopelvic ascites. Musculoskeletal: No acute or significant osseous findings. IMPRESSION: 1. No acute localizing process in the abdomen or pelvis. 2. 2.3 cm right ovarian simple-appearing cyst. No follow-up imaging is recommended. Reference: JACR 2020 Feb;17(2):248-254 3. Large stool burden throughout the colon. 4. Cholecystectomy. 5. Small hepatic lipoma. Electronically Signed   By: Darliss Cheney M.D.   On: 06/19/2021 23:19  ? ? ?Assessment & Plan:  ? ?Rushie was seen today for abdominal pain  and anemia. ? ?Diagnoses and all orders for this visit: ? ?Irritable bowel syndrome with both constipation and diarrhea- Will treat for IBS. ?-     hyoscyamine (LEVSIN) 0.125 MG tablet; Take 1 tablet (0.125 mg total) by mouth every 6 (six) hours as needed. ? ?Abdominal pain, chronic, epigastric ? ?Anemia, unspecified type- Her H/H are normal now. ?-     CBC with Differential/Platelet; Future ?-     CBC with Differential/Platelet ? ? ?I have discontinued Myriam JacobsonDonna Magan "Lupita LeashDonna Zeiter"'s esomeprazole and meloxicam. I am also having her start on hyoscyamine. Additionally, I am having her maintain her citalopram, norethindrone, Qulipta, and polyethylene glycol. ? ?Meds ordered this encounter  ?Medications  ? hyoscyamine (LEVSIN) 0.125 MG tablet  ?  Sig: Take 1 tablet (0.125 mg total) by mouth every 6 (six) hours as needed.  ?  Dispense:  90 tablet  ?  Refill:  3  ? ? ? ?Follow-up: Return in about 6 months (around 12/25/2021). ? ?Sanda Lingerhomas Jani Moronta, MD ?

## 2021-06-25 NOTE — Patient Instructions (Signed)
Irritable Bowel Syndrome, Adult  Irritable bowel syndrome (IBS) is a group of symptoms that affects the organs responsible for digestion (gastrointestinal tract, or GI tract). IBS is not one specific disease. To regulate how the GI tract works, the body sends signals back and forth between the intestines and the brain. If you have IBS, there may be a problem with these signals. As a result, the GI tract does not function normally. The intestines may become more sensitive and overreact to certain things. This may be especially true when you eat certain foods or when you are under stress. There are four main types of IBS. These may be determined based on the consistency of your stool (feces): IBS with mostly (predominance of) diarrhea. IBS with predominance of constipation. IBS with mixed bowel habits. This includes both diarrhea and constipation. IBS unclassified. This includes IBS that cannot be categorized into one of the other three main types. It is important to know which type of IBS you have. Certain treatments are more likely to be helpful for certain types of IBS. What are the causes? The exact cause of IBS is not known. What increases the risk? You may have a higher risk for IBS if you: Are female. Are younger than 40 years. Have a family history of IBS. Have a mental health condition, such as depression, anxiety, or post-traumatic stress disorder. Have had a bacterial infection of your GI tract. What are the signs or symptoms? Symptoms of IBS vary from person to person. The main symptom is abdominal pain or discomfort. Other symptoms usually include one or more of the following: Diarrhea, constipation, or both. Swelling or bloating in the abdomen. Feeling full after eating a small or regular-sized meal. Frequent gas. Mucus in the stool. A feeling of having more stool left after a bowel movement. Symptoms tend to come and go. They may be triggered by stress, mental health  conditions, or certain foods. How is this diagnosed? This condition may be diagnosed based on a physical exam, your medical history, and your symptoms. You may have tests, such as: Blood tests. Stool test. Colonoscopy. This is a procedure in which your GI tract is viewed with a long, thin, flexible tube. How is this treated? There is no cure for IBS, but treatment can help relieve symptoms. Treatment depends on the type of IBS you have, and may include: Changes to your diet, such as: Avoiding foods that cause symptoms. Drinking more water. Following a low-FODMAP (fermentable oligosaccharides, disaccharides, monosaccharides, and polyols) diet for up to 6 weeks, or as told by your health care provider. FODMAPs are sugars that are hard for some people to digest. Eating more fiber. Eating small meals at the same times every day. Medicines. These may include: Fiber supplements, if you have constipation. Medicine to control diarrhea (antidiarrheal medicines). Medicine to help control muscle tightening (spasms) in your GI tract (antispasmodic medicines). Medicines to help with mental health conditions, such as antidepressants. Talk therapy or counseling. Working with a dietitian to help create a food plan that is right for you. Managing your stress. Follow these instructions at home: Eating and drinking  Eat a healthy diet. Eat 5-6 small meals a day. Try to eat meals at about the same times each day. Do not eat large meals. Gradually eat more fiber-rich foods. These include whole grains, fruits, and vegetables. This may be especially helpful if you have IBS with constipation. Eat a diet low in FODMAPs. You may need to avoid foods such as   citrus fruits, cabbage, garlic, and onions. Drink enough fluid to keep your urine pale yellow. Keep a journal of foods that seem to trigger symptoms. Avoid foods and drinks that: Contain added sugar. Make your symptoms worse. These may include dairy  products, caffeinated drinks, and carbonated drinks. Alcohol use Do not drink alcohol if: Your health care provider tells you not to drink. You are pregnant, may be pregnant, or are planning to become pregnant. If you drink alcohol: Limit how much you have to: 0-1 drink a day for women. 0-2 drinks a day for men. Know how much alcohol is in your drink. In the [place] one drink equals one 12 oz bottle of beer (355 mL), one 5 oz glass of wine (148 mL), or one 1 oz glass of hard liquor (44 mL) General instructions Take over-the-counter and prescription medicines only as told by your health care provider. This includes supplements. Get enough exercise. Do at least 150 minutes of moderate-intensity exercise each week. Manage your stress. Getting enough sleep and exercise can help you manage stress. Keep all follow-up visits. This is important. This includes all visits with your health care provider and therapist. Where to find more information International Foundation for Functional Gastrointestinal Disorders: aboutibs.org National Institute of Diabetes and Digestive and Kidney Diseases: niddk.nih.gov Contact a health care provider if: You have constant pain. You lose weight. You have diarrhea that gets worse. You have bleeding from the rectum. You vomit often. You have a fever. Get help right away if: You have severe abdominal pain. You have diarrhea with symptoms of dehydration, such as dizziness or dry mouth. You have bloody or black stools. You have severe abdominal bloating. You have vomiting that does not stop. You have blood in your vomit. Summary Irritable bowel syndrome (IBS) is not one specific disease. It is a group of symptoms that affects digestion. Your intestines may become more sensitive and overreact to certain things. This may be especially true when you eat certain foods or when you are under stress. There is no cure for IBS, but treatment can help relieve  symptoms. This information is not intended to replace advice given to you by your health care provider. Make sure you discuss any questions you have with your health care provider. Document Revised: 01/31/2021 Document Reviewed: 01/31/2021 Elsevier Patient Education  2023 Elsevier Inc.  

## 2021-12-03 ENCOUNTER — Ambulatory Visit: Payer: No Typology Code available for payment source | Admitting: Internal Medicine

## 2021-12-23 ENCOUNTER — Other Ambulatory Visit: Payer: Self-pay | Admitting: Obstetrics & Gynecology

## 2021-12-24 ENCOUNTER — Encounter (HOSPITAL_BASED_OUTPATIENT_CLINIC_OR_DEPARTMENT_OTHER): Payer: Self-pay

## 2021-12-24 ENCOUNTER — Ambulatory Visit (HOSPITAL_BASED_OUTPATIENT_CLINIC_OR_DEPARTMENT_OTHER): Payer: No Typology Code available for payment source | Admitting: Nurse Practitioner

## 2022-01-03 ENCOUNTER — Ambulatory Visit (HOSPITAL_BASED_OUTPATIENT_CLINIC_OR_DEPARTMENT_OTHER): Payer: No Typology Code available for payment source | Admitting: Family Medicine

## 2022-01-14 ENCOUNTER — Ambulatory Visit (INDEPENDENT_AMBULATORY_CARE_PROVIDER_SITE_OTHER): Payer: No Typology Code available for payment source | Admitting: Family Medicine

## 2022-01-14 ENCOUNTER — Encounter (HOSPITAL_BASED_OUTPATIENT_CLINIC_OR_DEPARTMENT_OTHER): Payer: Self-pay | Admitting: Family Medicine

## 2022-01-14 VITALS — BP 102/82 | HR 73 | Temp 97.6°F | Ht 60.0 in | Wt 142.6 lb

## 2022-01-14 DIAGNOSIS — R231 Pallor: Secondary | ICD-10-CM | POA: Diagnosis not present

## 2022-01-14 DIAGNOSIS — R079 Chest pain, unspecified: Secondary | ICD-10-CM | POA: Diagnosis not present

## 2022-01-14 DIAGNOSIS — E162 Hypoglycemia, unspecified: Secondary | ICD-10-CM

## 2022-01-14 DIAGNOSIS — F419 Anxiety disorder, unspecified: Secondary | ICD-10-CM

## 2022-01-14 MED ORDER — BUSPIRONE HCL 5 MG PO TABS: 5.0000 mg | ORAL_TABLET | Freq: Two times a day (BID) | ORAL | 1 refills | Status: DC

## 2022-01-14 MED ORDER — BLOOD GLUCOSE METER KIT: PACK | 0 refills | Status: DC

## 2022-01-14 NOTE — Patient Instructions (Signed)
  Medication Instructions:  Your physician recommends that you continue on your current medications as directed. Please refer to the Current Medication list given to you today. --If you need a refill on any your medications before your next appointment, please call your pharmacy first. If no refills are authorized on file call the office.-- Lab Work: Your physician has recommended that you have lab work today: Yes If you have labs (blood work) drawn today and your tests are completely normal, you will receive your results via MyChart message OR a phone call from our staff.  Please ensure you check your voicemail in the event that you authorized detailed messages to be left on a delegated number. If you have any lab test that is abnormal or we need to change your treatment, we will call you to review the results.  Referrals/Procedures/Imaging: No  Follow-Up: Your next appointment:   Your physician recommends that you schedule a follow-up appointment in: 3 weeks with Dr. de [place].  You will receive a text message or e-mail with a link to a survey about your care and experience with [place] today! We would greatly appreciate your feedback!   Thanks for letting us be apart of your health journey!!  Primary Care and Sports Medicine   Dr. Ceasar Mons [place]   We encourage you to activate your patient portal called "MyChart".  Sign up information is provided on this After Visit Summary.  MyChart is used to connect with patients for Virtual Visits (Telemedicine).  Patients are able to view lab/test results, encounter notes, upcoming appointments, etc.  Non-urgent messages can be sent to your provider as well. To learn more about what you can do with MyChart, please visit --  [email or web address].

## 2022-01-14 NOTE — Assessment & Plan Note (Signed)
Patient has had improvement with use of citalopram, however does feel that she has some continued symptoms.  It is also possible that these intermittent episodes that she has been experiencing could be related to anxiety/panic attacks.  Due to this, we will make some adjustments in her medication regimen today.  We will continue escitalopram at 30 mg daily and will augment with use of buspirone at 5 mg twice daily and follow-up in about 3 weeks for monitoring, this can be a virtual visit We also discussed role of counseling/therapy, however she would prefer to continue with medication at this time and assess progress with new medication being added.  Advised that if she would like to proceed with referral, she can let us know and we can place this at any time

## 2022-01-14 NOTE — Progress Notes (Signed)
New Patient Office Visit  Subjective    Patient ID: Sandra Brewer, female    DOB: 11-06-1990  Age: 31 y.o. MRN: [id number]  CC:  Chief Complaint  Patient presents with   New Patient (Initial Visit)    Pt here to establish new care, pt only concern is she stated she feel like her blood sugar has been low for a couple of months, pt also stated her anxiety is a concern as well     HPI Sandra Brewer presents to establish care Last PCP - Dr. Ronnald Ramp  Feels cool and clammy at times, will feel short of breath during these episodes. Occasionally has some chest pain with this. Lasts about 5-10 minutes and then resolves. No LOC. Has been going on for about 1 year. Wonders about low blood sugars. Feels that episodes happen a couple times per month.  She does have history of anxiety. She is currently taking citalopram, has been taking for 2-3 years. She is taking 30 mg daily. She felt that it was helping more when she first started medications. May not be as helpful currently. No other medications used in the past. She has not done any counseling or therapy in the past.  Patient is originally from [place]. 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 She enjoys reading, dance with her daughters, spend time with family, being outdoors.  Outpatient Encounter Medications as of 01/14/2022  Medication Sig   blood glucose meter kit and supplies Please dispense glucometer, test strips and lancets - dispense based on patient and insurance preference (E16.2)   busPIRone (BUSPAR) 5 MG tablet Take 1 tablet (5 mg total) by mouth 2 (two) times daily.   citalopram (CELEXA) 20 MG tablet TAKE 1 AND 1/2 TABLETS DAILY BY MOUTH   hyoscyamine (LEVSIN) 0.125 MG tablet Take 1 tablet (0.125 mg total) by mouth every 6 (six) hours as needed.   norethindrone (MICRONOR) 0.35 MG tablet Take 1 tablet (0.35 mg total) by mouth daily.   polyethylene glycol (MIRALAX) 17 g packet Take 17 g by mouth daily.   QULIPTA 60 MG TABS  TAKE ONE (1) TABLET BY MOUTH DAILY.   No facility-administered encounter medications on file as of 01/14/2022.    Past Medical History:  Diagnosis Date   Gall stones    Migraines     Past Surgical History:  Procedure Laterality Date   CHOLECYSTECTOMY N/A 11/20/2015   Procedure: LAPAROSCOPIC CHOLECYSTECTOMY;  Surgeon: Aviva Signs, MD;  Location: [facility];  Service: General;  Laterality: N/A;   LAPAROSCOPIC BILATERAL SALPINGECTOMY Bilateral 10/10/2015   Procedure: LAPAROSCOPIC BILATERAL SALPINGECTOMY;  Surgeon: Jonnie Kind, MD;  Location: [facility];  Service: Gynecology;  Laterality: Bilateral;   NO PAST SURGERIES      Family History  Problem Relation Age of Onset   Hypertension Mother    Hypertension Father    Heart disease Father    Heart attack Father     Social History   Socioeconomic History   Marital status: Married    Spouse name: Not on file   Number of children: Not on file   Years of education: Not on file   Highest education level: Not on file  Occupational History   Not on file  Tobacco Use   Smoking status: Never   Smokeless tobacco: Never  Vaping Use   Vaping Use: Never used  Substance and Sexual Activity   Alcohol use: No   Drug use: No   Sexual activity:  Yes    Birth control/protection: Surgical, Pill    Comment: tubal  Other Topics Concern   Not on file  Social History Narrative   Not on file   Social Determinants of Health   Financial Resource Strain: Not on file  Food Insecurity: Not on file  Transportation Needs: Not on file  Physical Activity: Not on file  Stress: Not on file  Social Connections: Not on file  Intimate Partner Violence: Not on file    Objective    BP 102/82   Pulse 73   Temp 97.6 F (36.4 C) (Oral)   Ht 5' (1.524 m)   Wt 142 lb 9.6 oz (64.7 kg)   SpO2 100%   BMI 27.85 kg/m   Physical Exam  31 year old female in no acute distress Cardiovascular exam with regular rate and rhythm, no murmur appreciated Lungs  clear to auscultation bilaterally  Assessment & Plan:   Problem List Items Addressed This Visit       Other   Anxiety - Primary    Patient has had improvement with use of citalopram, however does feel that she has some continued symptoms.  It is also possible that these intermittent episodes that she has been experiencing could be related to anxiety/panic attacks.  Due to this, we will make some adjustments in her medication regimen today.  We will continue escitalopram at 30 mg daily and will augment with use of buspirone at 5 mg twice daily and follow-up in about 3 weeks for monitoring, this can be a virtual visit We also discussed role of counseling/therapy, however she would prefer to continue with medication at this time and assess progress with new medication being added.  Advised that if she would like to proceed with referral, she can let us know and we can place this at any time      Relevant Medications   busPIRone (BUSPAR) 5 MG tablet   Other Visit Diagnoses     Chest pain, unspecified type       Relevant Orders   CBC with Differential/Platelet   TSH Rfx on Abnormal to Free T4   Hemoglobin A1c   Clammy skin       Relevant Orders   TSH Rfx on Abnormal to Free T4   Hemoglobin A1c   Low blood sugar       Relevant Medications   blood glucose meter kit and supplies     Feel that underlying symptoms are most likely related to underlying anxiety and less likely to be related to episodes of hypoglycemia or cardiac etiology, however we will further evaluate with initial laboratory testing today.  We will also provide patient with glucometer to allow her to check blood sugar when episodes do occur.  We will plan for close follow-up in the next few weeks to assess how she is doing with medication changes.  Return in about 3 weeks (around 02/04/2022).   Khylen Riolo J [place], MD

## 2022-01-15 ENCOUNTER — Encounter (HOSPITAL_BASED_OUTPATIENT_CLINIC_OR_DEPARTMENT_OTHER): Payer: Self-pay | Admitting: Family Medicine

## 2022-01-15 LAB — CBC WITH DIFFERENTIAL/PLATELET
Basophils Absolute: 0.1 10*3/uL (ref 0.0–0.2)
Basos: 1 %
EOS (ABSOLUTE): 0.2 10*3/uL (ref 0.0–0.4)
Eos: 3 %
Hematocrit: 37.6 % (ref 34.0–46.6)
Hemoglobin: 12.6 g/dL (ref 11.1–15.9)
Immature Grans (Abs): 0 10*3/uL (ref 0.0–0.1)
Immature Granulocytes: 0 %
Lymphocytes Absolute: 2 10*3/uL (ref 0.7–3.1)
Lymphs: 37 %
MCH: 31.7 pg (ref 26.6–33.0)
MCHC: 33.5 g/dL (ref 31.5–35.7)
MCV: 95 fL (ref 79–97)
Monocytes Absolute: 0.4 10*3/uL (ref 0.1–0.9)
Monocytes: 7 %
Neutrophils Absolute: 2.9 10*3/uL (ref 1.4–7.0)
Neutrophils: 52 %
Platelets: 322 10*3/uL (ref 150–450)
RBC: 3.98 x10E6/uL (ref 3.77–5.28)
RDW: 11.7 % (ref 11.7–15.4)
WBC: 5.5 10*3/uL (ref 3.4–10.8)

## 2022-01-15 LAB — HEMOGLOBIN A1C
Est. average glucose Bld gHb Est-mCnc: 105 mg/dL
Hgb A1c MFr Bld: 5.3 % (ref 4.8–5.6)

## 2022-01-15 LAB — TSH RFX ON ABNORMAL TO FREE T4: TSH: 1.45 u[IU]/mL (ref 0.450–4.500)

## 2022-01-29 ENCOUNTER — Other Ambulatory Visit: Payer: Self-pay | Admitting: Internal Medicine

## 2022-01-29 DIAGNOSIS — G43019 Migraine without aura, intractable, without status migrainosus: Secondary | ICD-10-CM

## 2022-02-04 ENCOUNTER — Ambulatory Visit (INDEPENDENT_AMBULATORY_CARE_PROVIDER_SITE_OTHER): Payer: No Typology Code available for payment source | Admitting: Family Medicine

## 2022-02-04 ENCOUNTER — Encounter (HOSPITAL_BASED_OUTPATIENT_CLINIC_OR_DEPARTMENT_OTHER): Payer: Self-pay | Admitting: Family Medicine

## 2022-02-04 VITALS — BP 110/67 | HR 63 | Ht 60.0 in | Wt 146.2 lb

## 2022-02-04 DIAGNOSIS — F419 Anxiety disorder, unspecified: Secondary | ICD-10-CM

## 2022-02-04 DIAGNOSIS — G43019 Migraine without aura, intractable, without status migrainosus: Secondary | ICD-10-CM

## 2022-02-04 MED ORDER — QULIPTA 60 MG PO TABS: 1.0000 | ORAL_TABLET | Freq: Every day | ORAL | 1 refills | Status: DC

## 2022-02-04 NOTE — Progress Notes (Signed)
    Procedures performed today:    None.  Independent interpretation of notes and tests performed by another provider:   None.  Brief History, Exam, Impression, and Recommendations:    BP 110/67 (BP Location: Right Arm, Patient Position: Sitting, Cuff Size: Normal)   Pulse 63   Ht 5' (1.524 m)   Wt 146 lb 3.2 oz (66.3 kg)   SpO2 100%   BMI 28.55 kg/m   Anxiety She reports that she does feel that symptoms have been improved since last office visit. She feels that she has been doing well with use of BuSpar for augmentation of Celexa.  Since last appointment with me a few weeks ago, she has noted 2 episodes of clamminess and chest pain, generally though, this has been doing better for her At this time, we can continue with current medication regimen, no changes to be made today Plan for follow-up in about 6 to 8 weeks to assess progress, if doing well, can continue with current medications.  If any residual symptoms, could consider adjustment in buspirone.  Consideration would be for slight increase in total daily dose.  Given that she is on milligram total daily dose, can increase to 50 mg total daily dose which would be achieved by switching from 5 mg twice daily to 5 mg 3 times daily.  Intractable migraine without aura and without status migrainosus Patient with history of migraines.  She currently utilizes [place] for control of migraines, indicates satisfactory control at present with use of medication.  She is requesting refill of Qvar today, refill sent to pharmacy on file.  Return in about 6 weeks (around 03/18/2022) for med check.   ___________________________________________ Quan Cybulski de [place], MD, ABFM, [facility] Primary Care and Sports Medicine [facility]

## 2022-02-04 NOTE — Patient Instructions (Signed)
  Medication Instructions:  Your physician recommends that you continue on your current medications as directed. Please refer to the Current Medication list given to you today. --If you need a refill on any your medications before your next appointment, please call your pharmacy first. If no refills are authorized on file call the office.-- Lab Work: Your physician has recommended that you have lab work today: No If you have labs (blood work) drawn today and your tests are completely normal, you will receive your results via MyChart message OR a phone call from our staff.  Please ensure you check your voicemail in the event that you authorized detailed messages to be left on a delegated number. If you have any lab test that is abnormal or we need to change your treatment, we will call you to review the results.  Referrals/Procedures/Imaging: No  Follow-Up: Your next appointment:   Your physician recommends that you schedule a follow-up appointment in: 6-8 weeks with Dr. de [place].  You will receive a text message or e-mail with a link to a survey about your care and experience with us today! We would greatly appreciate your feedback!   Thanks for letting us be apart of your health journey!!  Primary Care and Sports Medicine   Dr. Raymond de [place]   We encourage you to activate your patient portal called "MyChart".  Sign up information is provided on this After Visit Summary.  MyChart is used to connect with patients for Virtual Visits (Telemedicine).  Patients are able to view lab/test results, encounter notes, upcoming appointments, etc.  Non-urgent messages can be sent to your provider as well. To learn more about what you can do with MyChart, please visit --  [email or web address]    

## 2022-02-04 NOTE — Assessment & Plan Note (Addendum)
She reports that she does feel that symptoms have been improved since last office visit. She feels that she has been doing well with use of BuSpar for augmentation of Celexa.  Since last appointment with me a few weeks ago, she has noted 2 episodes of clamminess and chest pain, generally though, this has been doing better for her At this time, we can continue with current medication regimen, no changes to be made today Plan for follow-up in about 6 to 8 weeks to assess progress, if doing well, can continue with current medications.  If any residual symptoms, could consider adjustment in buspirone.  Consideration would be for slight increase in total daily dose.  Given that she is on milligram total daily dose, can increase to 50 mg total daily dose which would be achieved by switching from 5 mg twice daily to 5 mg 3 times daily.

## 2022-02-06 ENCOUNTER — Encounter (HOSPITAL_BASED_OUTPATIENT_CLINIC_OR_DEPARTMENT_OTHER): Payer: Self-pay | Admitting: Family Medicine

## 2022-02-06 DIAGNOSIS — G43019 Migraine without aura, intractable, without status migrainosus: Secondary | ICD-10-CM

## 2022-02-08 ENCOUNTER — Encounter (HOSPITAL_BASED_OUTPATIENT_CLINIC_OR_DEPARTMENT_OTHER): Payer: Self-pay | Admitting: Family Medicine

## 2022-02-11 MED ORDER — QULIPTA 60 MG PO TABS: 1.0000 | ORAL_TABLET | Freq: Every day | ORAL | 1 refills | Status: DC

## 2022-03-18 ENCOUNTER — Ambulatory Visit (HOSPITAL_BASED_OUTPATIENT_CLINIC_OR_DEPARTMENT_OTHER): Payer: Medicaid Other | Admitting: Family Medicine

## 2022-03-22 NOTE — Assessment & Plan Note (Signed)
Patient with history of migraines.  She currently utilizes [place] for control of migraines, indicates satisfactory control at present with use of medication.  She is requesting refill of Qvar today, refill sent to pharmacy on file.

## 2022-03-29 ENCOUNTER — Encounter (HOSPITAL_BASED_OUTPATIENT_CLINIC_OR_DEPARTMENT_OTHER): Payer: Self-pay | Admitting: Family Medicine

## 2022-04-15 ENCOUNTER — Encounter (HOSPITAL_BASED_OUTPATIENT_CLINIC_OR_DEPARTMENT_OTHER): Payer: Self-pay | Admitting: Family Medicine

## 2022-04-15 ENCOUNTER — Ambulatory Visit (HOSPITAL_BASED_OUTPATIENT_CLINIC_OR_DEPARTMENT_OTHER): Payer: Medicaid Other | Admitting: Family Medicine

## 2022-04-15 ENCOUNTER — Other Ambulatory Visit (HOSPITAL_COMMUNITY): Payer: Self-pay

## 2022-04-15 ENCOUNTER — Ambulatory Visit (INDEPENDENT_AMBULATORY_CARE_PROVIDER_SITE_OTHER): Payer: 59 | Admitting: Family Medicine

## 2022-04-15 VITALS — BP 105/83 | HR 66 | Ht 60.0 in | Wt 150.6 lb

## 2022-04-15 DIAGNOSIS — G43019 Migraine without aura, intractable, without status migrainosus: Secondary | ICD-10-CM

## 2022-04-15 DIAGNOSIS — F419 Anxiety disorder, unspecified: Secondary | ICD-10-CM

## 2022-04-15 MED ORDER — BUSPIRONE HCL 5 MG PO TABS: 5.0000 mg | ORAL_TABLET | Freq: Two times a day (BID) | ORAL | 1 refills | Status: AC

## 2022-04-15 MED ORDER — QULIPTA 60 MG PO TABS
1.0000 | ORAL_TABLET | Freq: Every day | ORAL | 1 refills | Status: DC
  Filled 2022-04-15: qty 90, 90d supply, fill #0

## 2022-04-15 MED ORDER — QULIPTA 60 MG PO TABS: 1.0000 | ORAL_TABLET | Freq: Every day | ORAL | 1 refills | Status: DC

## 2022-04-15 NOTE — Assessment & Plan Note (Addendum)
Taking Buspar 5 mg BID as prescribed.  Reports symptoms are well-controlled, reports that she has an occasional heart palpitation which lasts 2 seconds.  She wishes to continue on current dose for anxiety.  Refill sent.

## 2022-04-15 NOTE — Progress Notes (Signed)
Established Patient Office Visit  Subjective   Patient ID: Zai Lyndon, female    DOB: [birth year]  Age: 32 y.o. MRN: [id number]  Chief Complaint  Patient presents with   Medication Management    Pt here for med check     HPI  Migraine: did not get Qulipta due to mix up with pharmacy.  This is not a new medication for patient. Has been off for a while. Having two migraines per week. Taking ibuprofen, depends on the day if it is effective.  Wishes to restart Qulipta as soon as possible as this has helped in the past.   Anxiety: started on Buspar 5 mg on 02/04/22. Symptoms are being  managed well, does not need increased dose today. Endorses occasional heart palpitations that last 2 seconds and resolve. Denies suicidal ideation. Wants to continue with current dose.   Chart review: last seen on 02/04/22. Started on Buspar 5 mg.  Migraine controlled on Qulipta when she is able to obtain medication.   Review of Systems  Eyes:  Negative for blurred vision and double vision.  Respiratory:  Negative for shortness of breath.   Cardiovascular:  Positive for palpitations (last for 2 seconds and resolves.). Negative for chest pain.  Gastrointestinal:  Negative for abdominal pain, nausea and vomiting.  Neurological:  Negative for dizziness, weakness and headaches.  Psychiatric/Behavioral:  Negative for depression and suicidal ideas. The patient is nervous/anxious (symptoms being managed well).       Objective:     BP 105/83 (BP Location: Left Arm, Patient Position: Sitting, Cuff Size: Normal)   Pulse 66   Ht 5' (1.524 m)   Wt 150 lb 9.6 oz (68.3 kg)   SpO2 100%   BMI 29.41 kg/m    Physical Exam Vitals and nursing note reviewed.  Constitutional:      General: She is not in acute distress.    Appearance: Normal appearance.  Cardiovascular:     Rate and Rhythm: Normal rate and regular rhythm.     Heart sounds: Normal heart sounds.  Pulmonary:     Effort: Pulmonary effort is normal.      Breath sounds: Normal breath sounds.  Skin:    General: Skin is warm and dry.     Capillary Refill: Capillary refill takes less than 2 seconds.  Neurological:     General: No focal deficit present.     Mental Status: She is alert. Mental status is at baseline.  Psychiatric:        Mood and Affect: Mood normal.        Behavior: Behavior normal.        Thought Content: Thought content normal.        Judgment: Judgment normal.     No results found for any visits on 04/15/22.    The ASCVD Risk score (Arnett DK, et al., 2019) failed to calculate for the following reasons:   The 2019 ASCVD risk score is only valid for ages 72 to 54    Assessment & Plan:   Problem List Items Addressed This Visit     Anxiety    Taking Buspar 5 mg BID as prescribed.  Reports symptoms are well-controlled, reports that she has an occasional heart palpitation which lasts 2 seconds.  She wishes to continue on current dose for anxiety.  Refill sent.      Relevant Medications   busPIRone (BUSPAR) 5 MG tablet   Intractable migraine without aura and without status migrainosus - Primary  Has been on Qulipta this past year. Has been off for some time due to pharmacy mix up. CMA phoned pharmacy to discover this medication is required to be sent to [place] for maintenance. Reports she has had approximately 2 migraines per week, has been taking ibuprofen, which helps at times and other times not.  Will restart Qulipta 60 mg daily and send to appropriate pharmacy.       Relevant Medications   Atogepant (QULIPTA) 60 MG TABS  Agrees with plan of care discussed.  Questions answered.   Return in about 3 months (around 07/14/2022) for anxiety and migraine .    Chalmers Guest, FNP

## 2022-04-15 NOTE — Assessment & Plan Note (Signed)
Has been on Qulipta this past year. Has been off for some time due to pharmacy mix up. CMA phoned pharmacy to discover this medication is required to be sent to [place] for maintenance. Reports she has had approximately 2 migraines per week, has been taking ibuprofen, which helps at times and other times not.  Will restart Qulipta 60 mg daily and send to appropriate pharmacy.

## 2022-04-19 ENCOUNTER — Other Ambulatory Visit (HOSPITAL_COMMUNITY): Payer: Self-pay

## 2022-04-29 ENCOUNTER — Encounter: Payer: Self-pay | Admitting: Family Medicine

## 2022-05-16 ENCOUNTER — Ambulatory Visit (HOSPITAL_BASED_OUTPATIENT_CLINIC_OR_DEPARTMENT_OTHER): Payer: 59 | Admitting: Family Medicine

## 2022-05-23 ENCOUNTER — Encounter (HOSPITAL_BASED_OUTPATIENT_CLINIC_OR_DEPARTMENT_OTHER): Payer: Self-pay | Admitting: Family Medicine

## 2022-06-04 ENCOUNTER — Other Ambulatory Visit (HOSPITAL_COMMUNITY): Payer: Self-pay

## 2022-06-10 ENCOUNTER — Other Ambulatory Visit (HOSPITAL_COMMUNITY): Payer: Self-pay

## 2022-07-12 ENCOUNTER — Other Ambulatory Visit (HOSPITAL_COMMUNITY): Payer: Self-pay

## 2022-07-15 ENCOUNTER — Other Ambulatory Visit (HOSPITAL_COMMUNITY): Payer: Self-pay

## 2022-07-15 ENCOUNTER — Ambulatory Visit (INDEPENDENT_AMBULATORY_CARE_PROVIDER_SITE_OTHER): Payer: 59 | Admitting: Family Medicine

## 2022-07-15 ENCOUNTER — Ambulatory Visit (INDEPENDENT_AMBULATORY_CARE_PROVIDER_SITE_OTHER): Payer: 59

## 2022-07-15 VITALS — BP 111/73 | HR 66 | Ht 60.0 in | Wt 160.9 lb

## 2022-07-15 DIAGNOSIS — M79642 Pain in left hand: Secondary | ICD-10-CM | POA: Diagnosis not present

## 2022-07-15 DIAGNOSIS — E669 Obesity, unspecified: Secondary | ICD-10-CM | POA: Diagnosis not present

## 2022-07-15 DIAGNOSIS — E66811 Obesity, class 1: Secondary | ICD-10-CM | POA: Insufficient documentation

## 2022-07-15 DIAGNOSIS — M79645 Pain in left finger(s): Secondary | ICD-10-CM | POA: Diagnosis not present

## 2022-07-15 NOTE — Assessment & Plan Note (Signed)
Patient reports that over about the past month she has had stiffness in her left hand which has been primarily noticed upon waking in the morning.  Generally this will improve after 10 to 30 minutes.  She has also had some discomfort over left ring finger, primarily near PIP joint.  She denies any numbness or tingling.  Has not had any similar symptoms or issues in the past.  Has not noticed any swelling.  Denies any recent trauma.  She is right-handed. On exam, patient has normal range of motion throughout left hand, normal sensation, normal radial pulse, normal cap refill.  She does not have any tenderness to palpation near base of left ring finger, no tenderness palpation at PIP or DIP joint of the left ring finger.  No locking or catching with range of motion assessment. Uncertain etiology given normal findings on exam today.  Feel that carpal tunnel syndrome is less likely given lack of neurologic symptoms, also feel that autoimmune is less likely at this time.  We can proceed with initial x-ray imaging to further assess.  If x-rays are reassuring, would recommend expectant management for now to monitor for any changes or progression moving forward.

## 2022-07-15 NOTE — Assessment & Plan Note (Signed)
Patient reports history of weight fluctuations over the past couple years.  She indicates that her weight has varied between 130 and 160 pounds.  She reports that she has tried focusing on lifestyle modifications including diet and exercise.  She generally does not eat breakfast in the morning.  She will have a salad or sandwich during lunch.  Reports having protein with either chicken or steak at night, occasional Posta.  She does try to stay active during the week, utilizing stairstepper most days after work, being active at home and at work.  She will run infrequently.  She has not tried any medications in the past. Weight today is up compared to prior recent office visits.  We did discuss considerations including referral to nutritionist/dietitian, referral to healthy weight and wellness clinic, medication options including oral and injectable medications which are available.  After discussion, patient elected to proceed with referral to healthy weight and wellness clinic.  Will monitor progress with this and determine need for any other treatment options such as pharmacotherapy.  Will plan for follow-up in about 6 to 8 weeks to monitor progress

## 2022-07-15 NOTE — Progress Notes (Signed)
    Procedures performed today:    None.  Independent interpretation of notes and tests performed by another provider:   None.  Brief History, Exam, Impression, and Recommendations:    BP 111/73 (BP Location: Right Arm, Patient Position: Sitting, Cuff Size: Normal)   Pulse 66   Ht 5' (1.524 m)   Wt 160 lb 14.4 oz (73 kg)   SpO2 100%   BMI 31.42 kg/m   Hand pain, left Patient reports that over about the past month she has had stiffness in her left hand which has been primarily noticed upon waking in the morning.  Generally this will improve after 10 to 30 minutes.  She has also had some discomfort over left ring finger, primarily near PIP joint.  She denies any numbness or tingling.  Has not had any similar symptoms or issues in the past.  Has not noticed any swelling.  Denies any recent trauma.  She is right-handed. On exam, patient has normal range of motion throughout left hand, normal sensation, normal radial pulse, normal cap refill.  She does not have any tenderness to palpation near base of left ring finger, no tenderness palpation at PIP or DIP joint of the left ring finger.  No locking or catching with range of motion assessment. Uncertain etiology given normal findings on exam today.  Feel that carpal tunnel syndrome is less likely given lack of neurologic symptoms, also feel that autoimmune is less likely at this time.  We can proceed with initial x-ray imaging to further assess.  If x-rays are reassuring, would recommend expectant management for now to monitor for any changes or progression moving forward.  Obesity, Class I, BMI 30-34.9 Patient reports history of weight fluctuations over the past couple years.  She indicates that her weight has varied between 130 and 160 pounds.  She reports that she has tried focusing on lifestyle modifications including diet and exercise.  She generally does not eat breakfast in the morning.  She will have a salad or sandwich during lunch.   Reports having protein with either chicken or steak at night, occasional Posta.  She does try to stay active during the week, utilizing stairstepper most days after work, being active at home and at work.  She will run infrequently.  She has not tried any medications in the past. Weight today is up compared to prior recent office visits.  We did discuss considerations including referral to nutritionist/dietitian, referral to healthy weight and wellness clinic, medication options including oral and injectable medications which are available.  After discussion, patient elected to proceed with referral to healthy weight and wellness clinic.  Will monitor progress with this and determine need for any other treatment options such as pharmacotherapy.  Will plan for follow-up in about 6 to 8 weeks to monitor progress  Return in about 6 weeks (around 08/26/2022) for Hand pain, weight.   ___________________________________________ Sandra Jeff de [place], MD, ABFM, [facility] Primary Care and Sports Medicine [facility]

## 2022-07-19 ENCOUNTER — Other Ambulatory Visit: Payer: Self-pay

## 2022-07-19 MED ORDER — METHYLPREDNISOLONE 4 MG PO TBPK: ORAL_TABLET | ORAL | 0 refills | Status: DC

## 2022-07-19 MED ORDER — MELOXICAM 15 MG PO TABS: 15.0000 mg | ORAL_TABLET | Freq: Every day | ORAL | 1 refills | Status: DC

## 2022-09-11 ENCOUNTER — Other Ambulatory Visit: Payer: Self-pay | Admitting: Oncology

## 2022-09-11 DIAGNOSIS — Z006 Encounter for examination for normal comparison and control in clinical research program: Secondary | ICD-10-CM

## 2022-09-16 ENCOUNTER — Encounter (HOSPITAL_BASED_OUTPATIENT_CLINIC_OR_DEPARTMENT_OTHER): Payer: Self-pay | Admitting: Family Medicine

## 2022-09-16 ENCOUNTER — Ambulatory Visit (INDEPENDENT_AMBULATORY_CARE_PROVIDER_SITE_OTHER): Payer: 59 | Admitting: Family Medicine

## 2022-09-16 VITALS — BP 112/71 | HR 70 | Ht 60.0 in | Wt 165.0 lb

## 2022-09-16 DIAGNOSIS — E669 Obesity, unspecified: Secondary | ICD-10-CM

## 2022-09-16 DIAGNOSIS — M79642 Pain in left hand: Secondary | ICD-10-CM

## 2022-09-16 NOTE — Assessment & Plan Note (Signed)
Patient reports that this has been doing better since last appointment, still present.  She did have x-rays completed.  X-rays were generally reassuring, no obvious bony or joint abnormality Given progress and reassuring x-rays, can continue with monitoring at this time

## 2022-09-16 NOTE — Patient Instructions (Signed)
   Medication Instructions:  Your physician recommends that you continue on your current medications as directed. Please refer to the Current Medication list given to you today. --If you need a refill on any your medications before your next appointment, please call your pharmacy first. If no refills are authorized on file call the office.-- Lab Work: Your physician has recommended that you have lab work today: No If you have labs (blood work) drawn today and your tests are completely normal, you will receive your results via [place] a phone call from our staff.  Please ensure you check your voicemail in the event that you authorized detailed messages to be left on a delegated number. If you have any lab test that is abnormal or we need to change your treatment, we will call you to review the results.  Referrals/Procedures/Imaging: No  Follow-Up: Your next appointment:   Your physician recommends that you schedule a follow-up appointment in: 3-4 months with Dr. de [place].  You will receive a text message or e-mail with a link to a survey about your care and experience with [place] today! We would greatly appreciate your feedback!   Thanks for letting us be apart of your health journey!!  Primary Care and Sports Medicine   Dr. Arlina Robes [place]   We encourage you to activate your patient portal called "MyChart".  Sign up information is provided on this After Visit Summary.  MyChart is used to connect with patients for Virtual Visits (Telemedicine).  Patients are able to view lab/test results, encounter notes, upcoming appointments, etc.  Non-urgent messages can be sent to your provider as well. To learn more about what you can do with MyChart, please visit --  [email or web address].

## 2022-09-16 NOTE — Progress Notes (Signed)
    Procedures performed today:    None.  Independent interpretation of notes and tests performed by another provider:   None.  Brief History, Exam, Impression, and Recommendations:    BP 112/71 (BP Location: Left Arm, Patient Position: Sitting, Cuff Size: Large)   Pulse 70   Ht 5' (1.524 m)   Wt 165 lb (74.8 kg)   SpO2 100%   BMI 32.22 kg/m   Obesity, Class I, BMI 30-34.9 Assessment & Plan: Patient has not met with healthy weight and wellness clinic yet, however has appointment scheduled for this week.  No new concerns at this time. We will monitor progress in a few months.  Can monitor consideration for pharmacotherapy moving forward   Hand pain, left Assessment & Plan: Patient reports that this has been doing better since last appointment, still present.  She did have x-rays completed.  X-rays were generally reassuring, no obvious bony or joint abnormality Given progress and reassuring x-rays, can continue with monitoring at this time   Return in about 3 months (around 12/17/2022) for weight and hand pain.   ___________________________________________ Sandra Jokerst de [place], MD, ABFM, [facility] Primary Care and Sports Medicine [facility]

## 2022-09-16 NOTE — Assessment & Plan Note (Signed)
Patient has not met with healthy weight and wellness clinic yet, however has appointment scheduled for this week.  No new concerns at this time. We will monitor progress in a few months.  Can monitor consideration for pharmacotherapy moving forward

## 2022-09-18 ENCOUNTER — Encounter (INDEPENDENT_AMBULATORY_CARE_PROVIDER_SITE_OTHER): Payer: 59 | Admitting: Internal Medicine

## 2022-10-02 ENCOUNTER — Encounter (HOSPITAL_BASED_OUTPATIENT_CLINIC_OR_DEPARTMENT_OTHER): Payer: Self-pay | Admitting: Family Medicine

## 2022-10-05 MED ORDER — SUMATRIPTAN SUCCINATE 50 MG PO TABS: 50.0000 mg | ORAL_TABLET | ORAL | 0 refills | Status: DC | PRN

## 2022-10-14 ENCOUNTER — Encounter (HOSPITAL_BASED_OUTPATIENT_CLINIC_OR_DEPARTMENT_OTHER): Payer: Self-pay | Admitting: Family Medicine

## 2022-10-14 ENCOUNTER — Ambulatory Visit (INDEPENDENT_AMBULATORY_CARE_PROVIDER_SITE_OTHER): Payer: 59 | Admitting: Family Medicine

## 2022-10-14 VITALS — BP 113/79 | HR 70 | Ht 60.0 in | Wt 167.9 lb

## 2022-10-14 DIAGNOSIS — Z0000 Encounter for general adult medical examination without abnormal findings: Secondary | ICD-10-CM | POA: Diagnosis not present

## 2022-10-14 MED ORDER — SUMATRIPTAN SUCCINATE 50 MG PO TABS: 50.0000 mg | ORAL_TABLET | ORAL | 2 refills | Status: DC | PRN

## 2022-10-14 NOTE — Patient Instructions (Signed)

## 2022-10-14 NOTE — Progress Notes (Signed)
Subjective:   Sandra Brewer Oct 01, 1990  10/14/2022   CC: Chief Complaint  Patient presents with   Annual Exam    Pt is here today for her physical. Pt denies any concerns for today.    HPI: Sandra Brewer is a 32 y.o. female who presents for a routine health maintenance exam.  Labs collected at time of visit.    HEALTH SCREENINGS: - Vision Screening: up to date - Dental Visits: up to date - Pap smear: up to date - Breast Exam:  Declined - STD Screening: Declined - Mammogram (40+): Not applicable  - Colonoscopy (45+): Not applicable  - Bone Density (65+ or under 65 with predisposing conditions): Not applicable  - Lung CA screening with low-dose CT:  Not applicable Adults age 67-80 who are current cigarette smokers or quit within the last 15 years. Must have 20 pack year history.   Depression and Anxiety Screen done today and results listed below:     10/14/2022    8:10 AM 09/16/2022    9:17 AM 07/15/2022    8:48 AM 04/15/2022    8:43 AM 02/04/2022    1:47 PM  Depression screen PHQ 2/9  Decreased Interest 1 0 0 1 1  Down, Depressed, Hopeless 1 0 0 1 1  PHQ - 2 Score 2 0 0 2 2  Altered sleeping 2   1 1   Tired, decreased energy 1   1 1   Change in appetite 0   0 0  Feeling bad or failure about yourself  0   0 0  Trouble concentrating 0   0 0  Moving slowly or fidgety/restless 0   0 0  Suicidal thoughts 0   0 0  PHQ-9 Score 5   4 4   Difficult doing work/chores Not difficult at all   Not difficult at all Not difficult at all      10/14/2022    8:11 AM 09/16/2022    9:17 AM 07/15/2022    8:48 AM 04/15/2022    8:43 AM  GAD 7 : Generalized Anxiety Score  Nervous, Anxious, on Edge 1 0 1 1  Control/stop worrying 0 0 0 0  Worry too much - different things 1 0 1 1  Trouble relaxing 1 0 0 1  Restless 0 0 0 1  Easily annoyed or irritable 1 0 1 1  Afraid - awful might happen 0 0 0 0  Total GAD 7 Score 4 0 3 5  Anxiety Difficulty Not difficult at all Not difficult at all Not  difficult at all Not difficult at all    IMMUNIZATIONS: - Tdap: Tetanus vaccination status reviewed: last tetanus booster within 10 years. - HPV: Refused - Influenza: Postponed to flu season - Pneumovax: Not applicable - Prevnar 20: Not applicable - Zostavax (50+): Not applicable   Past medical history, surgical history, medications, allergies, family history and social history reviewed with patient today and changes made to appropriate areas of the chart.   Past Medical History:  Diagnosis Date   Allergy    Seasonal   Anemia 2011   Pregnancy   Anxiety    Depression    Gall stones    Migraines     Past Surgical History:  Procedure Laterality Date   CHOLECYSTECTOMY N/A 11/20/2015   Procedure: LAPAROSCOPIC CHOLECYSTECTOMY;  Surgeon: Franky Macho, MD;  Location: [facility];  Service: General;  Laterality: N/A;   LAPAROSCOPIC BILATERAL SALPINGECTOMY Bilateral 10/10/2015   Procedure: LAPAROSCOPIC BILATERAL SALPINGECTOMY;  Surgeon:  Tilda Burrow, MD;  Location: [facility];  Service: Gynecology;  Laterality: Bilateral;   NO PAST SURGERIES     TUBAL LIGATION      Current Outpatient Medications on File Prior to Visit  Medication Sig   busPIRone (BUSPAR) 5 MG tablet Take 1 tablet (5 mg total) by mouth 2 (two) times daily.   citalopram (CELEXA) 20 MG tablet TAKE 1 AND 1/2 TABLETS DAILY BY MOUTH   hyoscyamine (LEVSIN) 0.125 MG tablet Take 1 tablet (0.125 mg total) by mouth every 6 (six) hours as needed.   No current facility-administered medications on file prior to visit.    No Known Allergies   Social History   Socioeconomic History   Marital status: Married    Spouse name: Not on file   Number of children: Not on file   Years of education: Not on file   Highest education level: Associate degree: occupational, Scientist, product/process development, or vocational program  Occupational History   Not on file  Tobacco Use   Smoking status: Never   Smokeless tobacco: Never  Vaping Use   Vaping status:  Never Used  Substance and Sexual Activity   Alcohol use: No   Drug use: No   Sexual activity: Yes    Birth control/protection: Surgical    Comment: tubal  Other Topics Concern   Not on file  Social History Narrative   Not on file   Social Determinants of Health   Financial Resource Strain: Low Risk  (07/12/2022)   Overall Financial Resource Strain (CARDIA)    Difficulty of Paying Living Expenses: Not hard at all  Food Insecurity: No Food Insecurity (07/12/2022)   Hunger Vital Sign    Worried About Running Out of Food in the Last Year: Never true    Ran Out of Food in the Last Year: Never true  Transportation Needs: No Transportation Needs (07/12/2022)   PRAPARE - Administrator, Civil Service (Medical): No    Lack of Transportation (Non-Medical): No  Physical Activity: Unknown (07/12/2022)   Exercise Vital Sign    Days of Exercise per Week: 7 days    Minutes of Exercise per Session: Patient declined  Stress: No Stress Concern Present (07/12/2022)   [employer] of Occupational Health - Occupational Stress Questionnaire    Feeling of Stress : Only a little  Social Connections: Unknown (07/12/2022)   Social Connection and Isolation Panel [NHANES]    Frequency of Communication with Friends and Family: Not on file    Frequency of Social Gatherings with Friends and Family: Not on file    Attends Religious Services: Patient declined    Active Member of Clubs or Organizations: No    Attends Engineer, structural: Not on file    Marital Status: Married  Catering manager Violence: Not on file   Social History   Tobacco Use  Smoking Status Never  Smokeless Tobacco Never   Social History   Substance and Sexual Activity  Alcohol Use No    Family History  Problem Relation Age of Onset   Hypertension Mother    Hypertension Father    Heart disease Father    Heart attack Father      ROS: Denies fever, fatigue, unexplained weight loss/gain, chest pain,  SHOB, and palpitations. Denies neurological deficits, gastrointestinal or genitourinary complaints, and skin changes.   Objective:   Today's Vitals   10/14/22 0808  BP: 113/79  Pulse: 70  SpO2: 100%  Weight: 167 lb 14.4 oz (  76.2 kg)  Height: 5' (1.524 m)    GENERAL APPEARANCE: Well-appearing, in NAD. Well nourished.  SKIN: Pink, warm and dry. Turgor normal. No rash, lesion, ulceration, or ecchymoses. Hair evenly distributed.  HEENT: HEAD: Normocephalic.  EYES: PERRLA. EOMI. Lids intact w/o defect. Sclera white, Conjunctiva pink w/o exudate.  EARS: External ear w/o redness, swelling, masses or lesions. EAC clear. TM's intact, translucent w/o bulging, appropriate landmarks visualized. Appropriate acuity to conversational tones.  NOSE: Septum midline w/o deformity. Nares patent, mucosa pink and non-inflamed w/o drainage. No sinus tenderness.  THROAT: Uvula midline. Oropharynx clear. Tonsils non-inflamed w/o exudate. Oral mucosa pink and moist.  NECK: Supple, Trachea midline. Full ROM w/o pain or tenderness. No lymphadenopathy. Thyroid non-tender w/o enlargement or palpable masses.  RESPIRATORY: Chest wall symmetrical w/o masses. Respirations even and non-labored. Breath sounds clear to auscultation bilaterally. No wheezes, rales, rhonchi, or crackles. CARDIAC: S1, S2 present, regular rate and rhythm. No gallops, murmurs, rubs, or clicks. PMI w/o lifts, heaves, or thrills. No carotid bruits. Capillary refill <2 seconds. Peripheral pulses 2+ bilaterally. GI: Abdomen soft w/o distention. Normoactive bowel sounds. No palpable masses or tenderness. No guarding or rebound tenderness. Liver and spleen w/o tenderness or enlargement. No CVA tenderness.  MSK: Muscle tone and strength appropriate for age, w/o atrophy or abnormal movement.  EXTREMITIES: Active ROM intact, w/o tenderness, crepitus, or contracture. No obvious joint deformities or effusions. No clubbing, edema, or cyanosis.  NEUROLOGIC:  CN's II-XII intact. Motor strength symmetrical with no obvious weakness. No sensory deficits. DTR's 2+ symmetric bilaterally. Steady, even gait.  PSYCH/MENTAL STATUS: Alert, oriented x 3. Cooperative, appropriate mood and affect.    Assessment & Plan:  1. Annual physical exam Doing well overall without concerns. Medications refilled. Will obtain fasting labs today and physical exam completed. Healthy lifestyle recommendations provided.  - Lipid panel - Comprehensive metabolic panel - CBC with Differential/Platelet - Hemoglobin A1c - TSH   PATIENT COUNSELING:  - Encouraged a healthy well-balanced diet. Patient may adjust caloric intake to maintain or achieve ideal body weight. May reduce intake of dietary saturated fat and total fat and have adequate dietary potassium and calcium preferably from fresh fruits, vegetables, and low-fat dairy products.   - Advised to avoid cigarette smoking. - Discussed with the patient that most people either abstain from alcohol or drink within safe limits (<=14/week and <=4 drinks/occasion for males, <=7/weeks and <= 3 drinks/occasion for females) and that the risk for alcohol disorders and other health effects rises proportionally with the number of drinks per week and how often a drinker exceeds daily limits. - Discussed cessation/primary prevention of drug use and availability of treatment for abuse.  - Discussed sexually transmitted diseases, avoidance of unintended pregnancy and contraceptive alternatives.  - Stressed the importance of regular exercise - Injury prevention: Discussed safety belts, safety helmets, smoke detector, smoking near bedding or upholstery.  - Dental health: Discussed importance of regular tooth brushing, flossing, and dental visits.   NEXT PREVENTATIVE PHYSICAL DUE IN 1 YEAR.  Return in about 6 months (around 04/16/2023) for Follow up Migraines.  Patient to reach out to office if new, worrisome, or unresolved symptoms arise or  if no improvement in patient's condition. Patient verbalized understanding and is agreeable to treatment plan. All questions answered to patient's satisfaction.    Yolanda Manges, FNP

## 2022-10-15 NOTE — Progress Notes (Signed)
Labs overall within normal limits. Slight glucose elevation, normal A1C. Elevated cholesterol - avoid greasy/fatty foods. Adhere to a diet with lean proteins, vegetables, fruits, and low carbohydrates. Drink plenty of water. Regular exercise. Repeat annually.

## 2022-12-09 ENCOUNTER — Encounter (HOSPITAL_BASED_OUTPATIENT_CLINIC_OR_DEPARTMENT_OTHER): Payer: Self-pay | Admitting: Family Medicine

## 2022-12-13 ENCOUNTER — Other Ambulatory Visit (HOSPITAL_BASED_OUTPATIENT_CLINIC_OR_DEPARTMENT_OTHER): Payer: Self-pay

## 2022-12-13 DIAGNOSIS — G43019 Migraine without aura, intractable, without status migrainosus: Secondary | ICD-10-CM

## 2022-12-13 NOTE — Telephone Encounter (Signed)
Referral placed.

## 2022-12-16 ENCOUNTER — Ambulatory Visit (HOSPITAL_BASED_OUTPATIENT_CLINIC_OR_DEPARTMENT_OTHER): Payer: 59 | Admitting: Family Medicine

## 2022-12-26 ENCOUNTER — Encounter (HOSPITAL_BASED_OUTPATIENT_CLINIC_OR_DEPARTMENT_OTHER): Payer: Self-pay | Admitting: Family Medicine

## 2022-12-26 ENCOUNTER — Ambulatory Visit (INDEPENDENT_AMBULATORY_CARE_PROVIDER_SITE_OTHER): Payer: 59 | Admitting: Family Medicine

## 2022-12-26 VITALS — BP 122/82 | HR 78 | Ht 60.0 in | Wt 171.9 lb

## 2022-12-26 DIAGNOSIS — M25561 Pain in right knee: Secondary | ICD-10-CM | POA: Insufficient documentation

## 2022-12-26 DIAGNOSIS — G43019 Migraine without aura, intractable, without status migrainosus: Secondary | ICD-10-CM

## 2022-12-26 MED ORDER — QULIPTA 60 MG PO TABS: 60.0000 mg | ORAL_TABLET | Freq: Every day | ORAL | 1 refills | Status: DC

## 2022-12-26 NOTE — Progress Notes (Signed)
    Procedures performed today:    None.  Independent interpretation of notes and tests performed by another provider:   None.  Brief History, Exam, Impression, and Recommendations:    BP 122/82 (BP Location: Right Arm, Patient Position: Sitting, Cuff Size: Normal)   Pulse 78   Ht 5' (1.524 m)   Wt 171 lb 14.4 oz (78 kg)   SpO2 99%   BMI 33.57 kg/m   Right knee pain, unspecified chronicity Assessment & Plan: Patient reports that she has been having right knee pain for about 1 month.  Does not necessarily recall inciting event, does think that she was moving x-ray plates when pain started.  Pain is primarily along medial aspect of knee.  Worse with prolonged sitting, activity.  Has not had any issue with knee in the past.  Has noticed any swelling, no instability. On exam, Right knee: No obvious deformity. No effusion.  Negative patellar grind.  Negative crepitus.  Tenderness to palpation along medial joint line as well as over pes anserine to a lesser degree Full ROM for flexion and extension.  Strength 5 out of 5 for flexion and extension. Anterior drawer: Negative Posterior drawer: Negative Lachman: Negative Varus stress test: Negative Valgus stress test: Negative McMurray's: Negative Thessaly: Negative Neurovascularly intact.  No evidence of lymphatic disease.  Exam generally unremarkable, no suspicion for structural injury, do not feel that there is any current injury to ligaments or meniscal injury.  I do feel the patient would benefit from physical therapy.  There may be a small degree of pes anserine bursitis We will proceed with PT referral today, can utilize icing, OTC medication, relative rest.  Discussed that PT will likely provide home exercise regimen, recommend adhering to this.  If not responding as expected, consider proceeding with imaging at that time  Orders: -     Ambulatory referral to Physical Therapy  Intractable migraine without aura and without  status migrainosus Assessment & Plan: At 1 point previously, patient was utilizing Qulipta with moderate relief.  Unfortunately, this was no longer being covered by insurance and she has more so been utilizing sumatriptan as needed which has not been very effective in controlling symptoms and has also led to some side effects.  She indicates that she will have migraines about 3 to 4 days/week. We discussed options, given that patient has utilized NSAID, triptan without relief of symptoms, we will look to restart CGRP agonist and look to obtain insurance approval for this.  If continuing to have difficulty with obtaining approval, then may need to transition to alternative.  Given frequency of symptoms, may need to consider prophylactic medication to be taken daily She does have appt scheduled with neurology in Jan  Orders: Bennie Pierini; Take 1 tablet (60 mg total) by mouth daily.  Dispense: 90 tablet; Refill: 1  Return in about 3 months (around 03/28/2023).   ___________________________________________ Evans Levee [place], MD, ABFM, CAQSM [facility]

## 2022-12-26 NOTE — Assessment & Plan Note (Signed)
Patient reports that she has been having right knee pain for about 1 month.  Does not necessarily recall inciting event, does think that she was moving x-ray plates when pain started.  Pain is primarily along medial aspect of knee.  Worse with prolonged sitting, activity.  Has not had any issue with knee in the past.  Has noticed any swelling, no instability. On exam, Right knee: No obvious deformity. No effusion.  Negative patellar grind.  Negative crepitus.  Tenderness to palpation along medial joint line as well as over pes anserine to a lesser degree Full ROM for flexion and extension.  Strength 5 out of 5 for flexion and extension. Anterior drawer: Negative Posterior drawer: Negative Lachman: Negative Varus stress test: Negative Valgus stress test: Negative McMurray's: Negative Thessaly: Negative Neurovascularly intact.  No evidence of lymphatic disease.  Exam generally unremarkable, no suspicion for structural injury, do not feel that there is any current injury to ligaments or meniscal injury.  I do feel the patient would benefit from physical therapy.  There may be a small degree of pes anserine bursitis We will proceed with PT referral today, can utilize icing, OTC medication, relative rest.  Discussed that PT will likely provide home exercise regimen, recommend adhering to this.  If not responding as expected, consider proceeding with imaging at that time

## 2022-12-26 NOTE — Assessment & Plan Note (Addendum)
At 1 point previously, patient was utilizing Qulipta with moderate relief.  Unfortunately, this was no longer being covered by insurance and she has more so been utilizing sumatriptan as needed which has not been very effective in controlling symptoms and has also led to some side effects.  She indicates that she will have migraines about 3 to 4 days/week. We discussed options, given that patient has utilized NSAID, triptan without relief of symptoms, we will look to restart CGRP agonist and look to obtain insurance approval for this.  If continuing to have difficulty with obtaining approval, then may need to transition to alternative.  Given frequency of symptoms, may need to consider prophylactic medication to be taken daily She does have appt scheduled with neurology in Jan

## 2022-12-27 ENCOUNTER — Ambulatory Visit (HOSPITAL_BASED_OUTPATIENT_CLINIC_OR_DEPARTMENT_OTHER): Payer: 59 | Admitting: Family Medicine

## 2023-01-01 ENCOUNTER — Encounter (HOSPITAL_BASED_OUTPATIENT_CLINIC_OR_DEPARTMENT_OTHER): Payer: Self-pay | Admitting: Family Medicine

## 2023-01-02 ENCOUNTER — Encounter (HOSPITAL_BASED_OUTPATIENT_CLINIC_OR_DEPARTMENT_OTHER): Payer: Self-pay | Admitting: *Deleted

## 2023-01-21 ENCOUNTER — Other Ambulatory Visit: Payer: Self-pay | Admitting: Obstetrics & Gynecology

## 2023-01-31 ENCOUNTER — Other Ambulatory Visit (HOSPITAL_COMMUNITY): Payer: Self-pay

## 2023-02-04 ENCOUNTER — Ambulatory Visit (HOSPITAL_BASED_OUTPATIENT_CLINIC_OR_DEPARTMENT_OTHER): Payer: 59 | Admitting: Physical Therapy

## 2023-02-07 IMAGING — CT CT ABD-PELV W/ CM
2 of 4 series · 12 of 46 positions shown, 14 images · IV contrast (agent unspecified)
Comparison: None.

CLINICAL DATA: Acute abdominal pain.

EXAM:
CT ABDOMEN AND PELVIS WITH CONTRAST
TECHNIQUE: Multidetector CT imaging of the abdomen and pelvis was performed
using the standard protocol following bolus administration of
intravenous contrast.

[Series 2: abd pelvis 5.00 br40 s3 axial · axial · 0.54mm/px · z∈[+1239,+1639]mm · 9 of 96 slices shown, 11 images]
[im 8/96  soft-tissue]
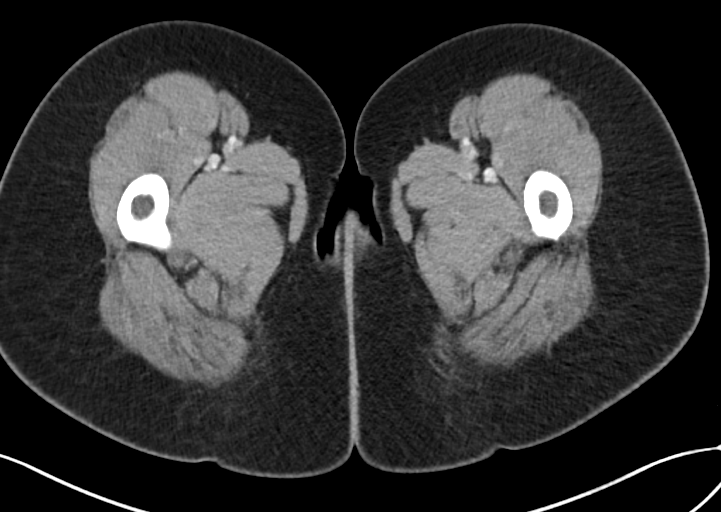
[im 8/96  bone]
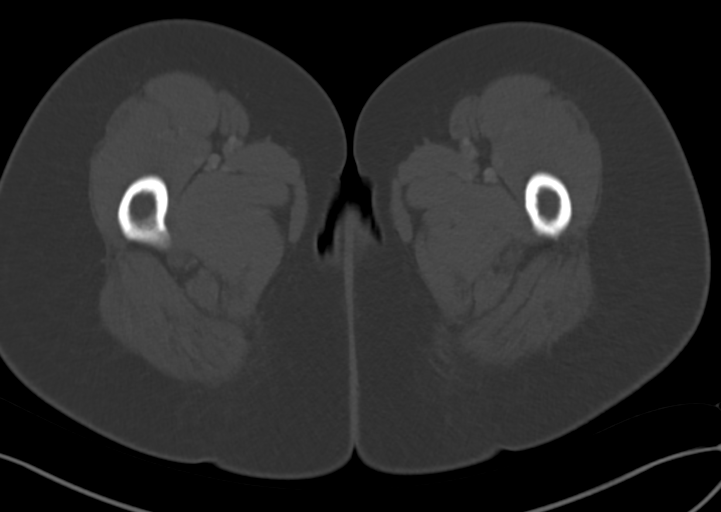
[im 16/96  soft-tissue]
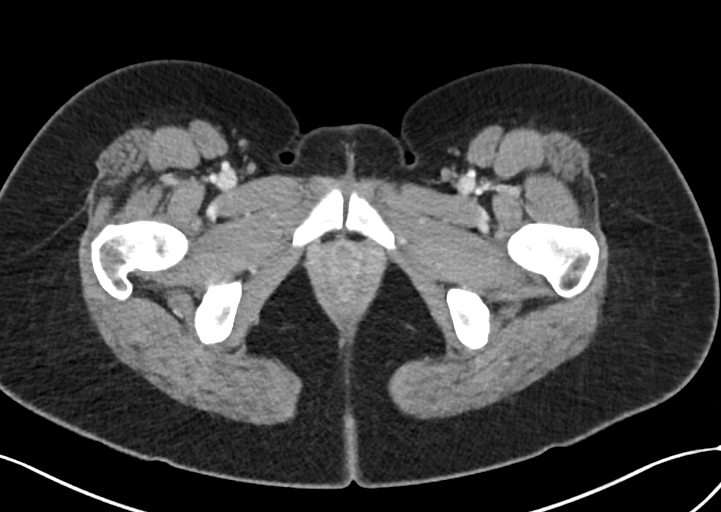
[im 28/96  soft-tissue]
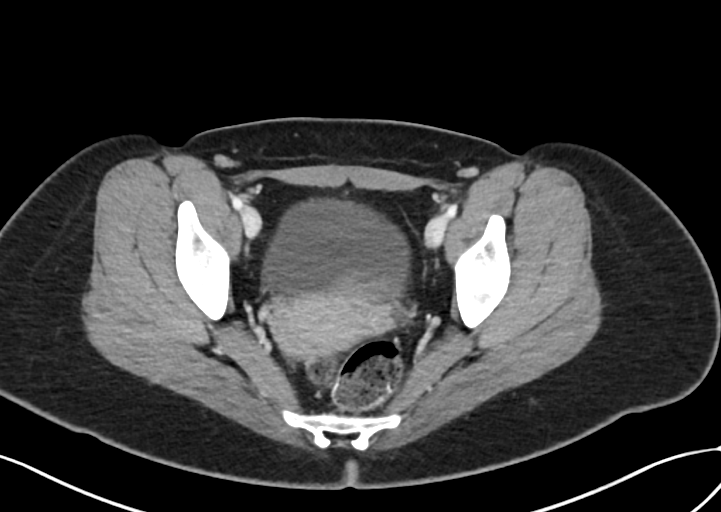
[im 36/96  soft-tissue]
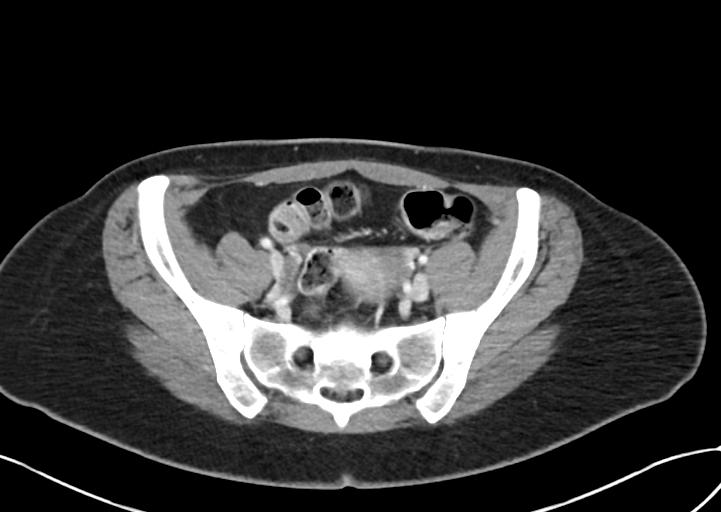
[im 48/96  soft-tissue]
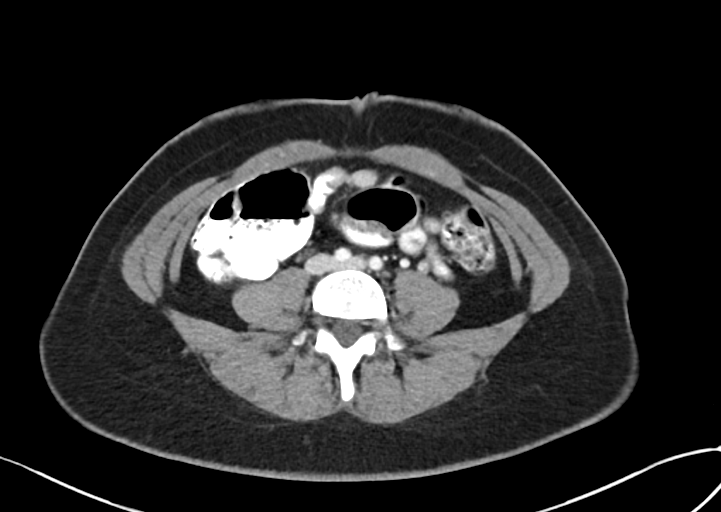
[im 60/96  soft-tissue]
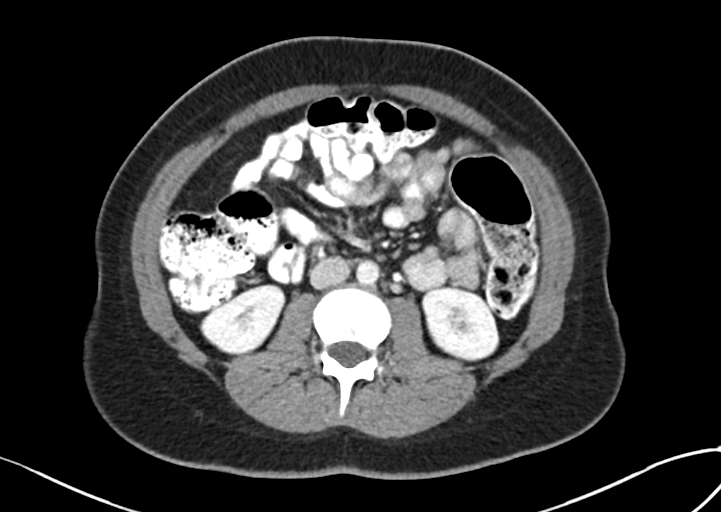
[im 68/96  soft-tissue]
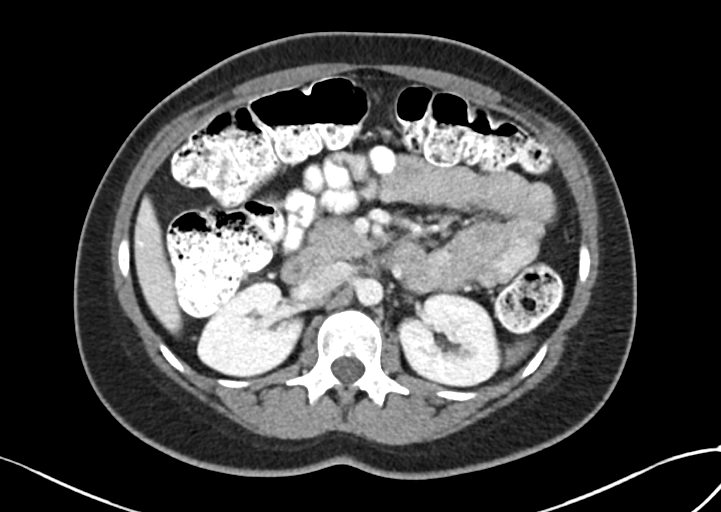
[im 80/96  soft-tissue]
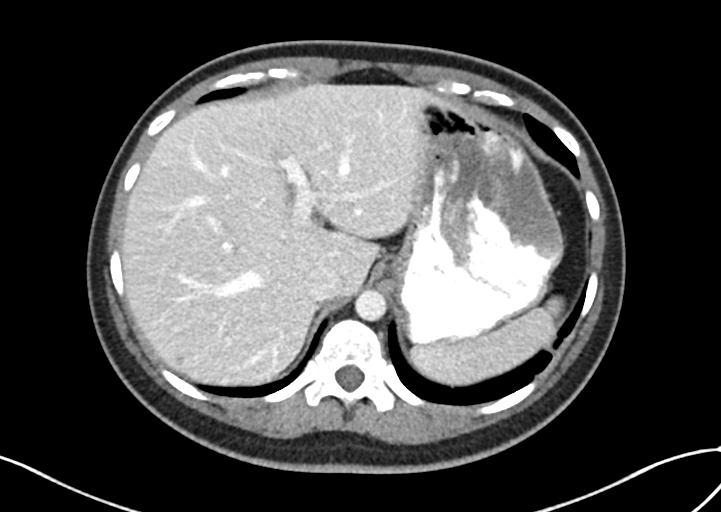
[im 88/96  soft-tissue]
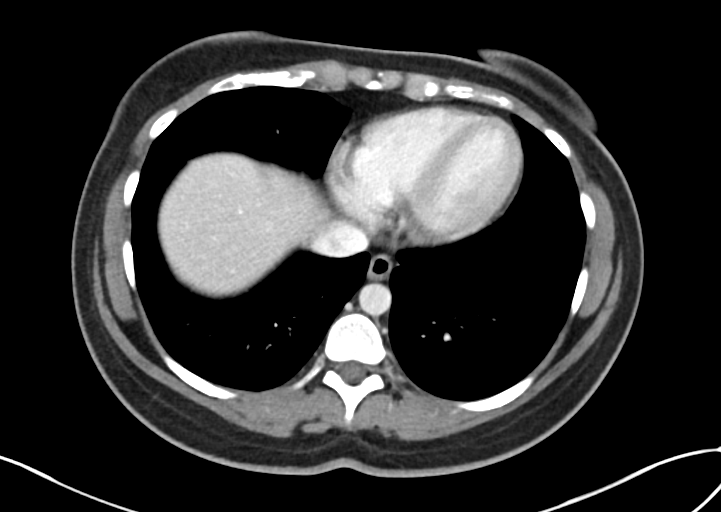
[im 88/96  bone]
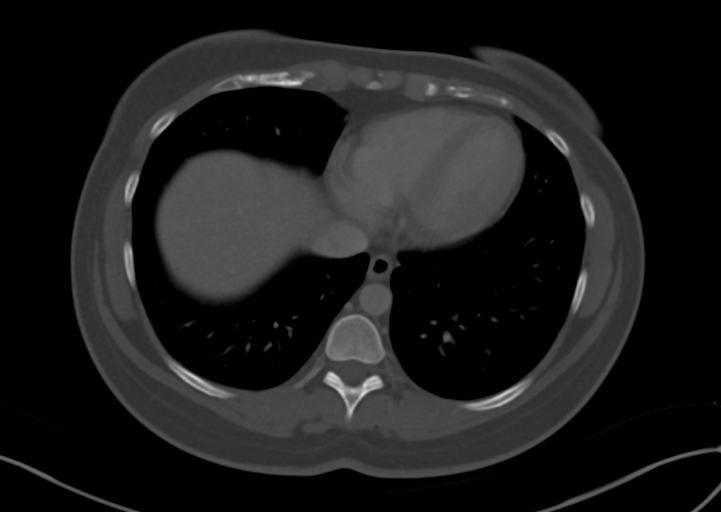

[Series 6: abd pelvis 2.00 br40 s3 cor · coronal · 0.76mm/px · 3 of 137 slices shown]
[im 46/137  soft-tissue]
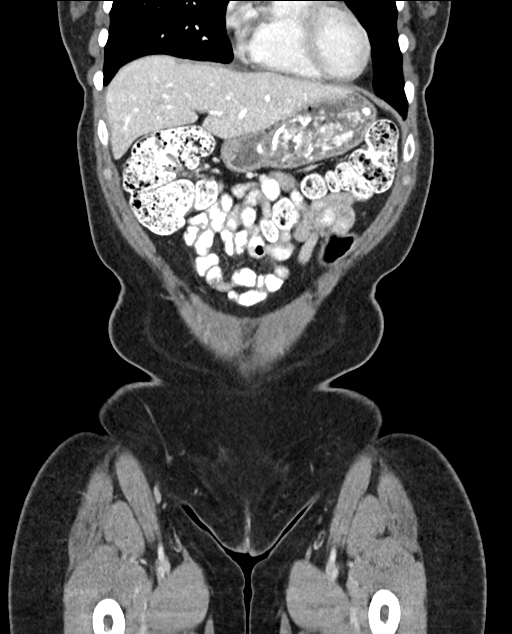
[im 61/137  soft-tissue]
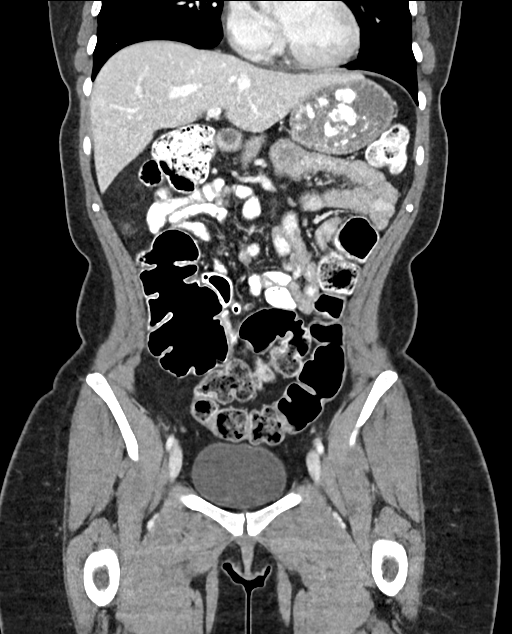
[im 76/137  soft-tissue]
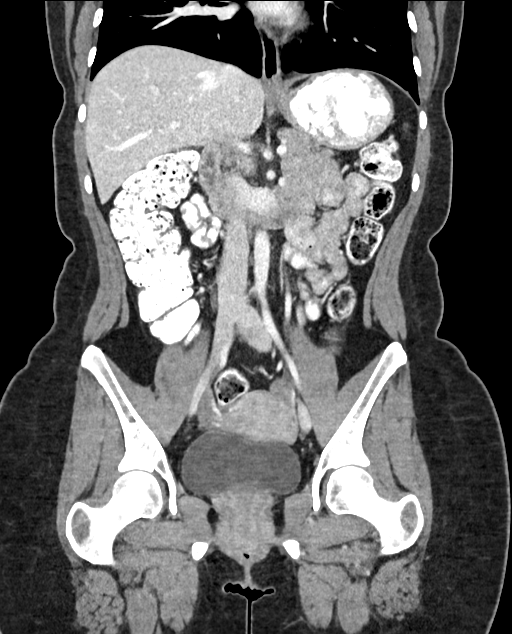

[12 of 46 positions shown; findings below may reference images not displayed]

RADIATION DOSE REDUCTION: This exam was performed according to the
departmental dose-optimization program which includes automated
exposure control, adjustment of the mA and/or kV according to
patient size and/or use of iterative reconstruction technique.

CONTRAST:  100mL JGS8B6-2CC IOPAMIDOL (JGS8B6-2CC) INJECTION 61%
FINDINGS: Lower chest: No acute abnormality.

Hepatobiliary: Gallbladder surgically absent. There is no biliary
ductal dilatation. There is a rounded hypodensity in the posterior
right lobe of the liver measuring 5 mm and less than 0 Hounsfield
units, likely small lipoma. Liver is otherwise within normal limits.

Pancreas: Unremarkable. No pancreatic ductal dilatation or
surrounding inflammatory changes.

Spleen: Normal in size without focal abnormality.

Adrenals/Urinary Tract: Adrenal glands are unremarkable. Kidneys are
normal, without renal calculi, focal lesion, or hydronephrosis.
Bladder is unremarkable.

Stomach/Bowel: Stomach is within normal limits. Appendix appears
normal. No evidence of bowel wall thickening, distention, or
inflammatory changes. There is a large amount of stool throughout
the colon.

Vascular/Lymphatic: No significant vascular findings are present. No
enlarged abdominal or pelvic lymph nodes.

Reproductive: There is a 2.3 cm cyst in the right ovary. Left ovary
and uterus are within normal limits.

Other: No abdominal wall hernia or abnormality. No abdominopelvic
ascites.

Musculoskeletal: No acute or significant osseous findings.
IMPRESSION: 1. No acute localizing process in the abdomen or pelvis.
[DATE] cm right ovarian simple-appearing cyst. No follow-up imaging
is recommended.
Reference: JACR [DATE]):[id number]
3. Large stool burden throughout the colon.
4. Cholecystectomy.
5. Small hepatic lipoma.

## 2023-02-17 ENCOUNTER — Other Ambulatory Visit (HOSPITAL_COMMUNITY): Payer: Self-pay

## 2023-02-17 MED ORDER — QULIPTA 60 MG PO TABS
60.0000 mg | ORAL_TABLET | ORAL | 1 refills | Status: DC
  Filled 2023-02-17: qty 90, 90d supply, fill #0

## 2023-02-21 ENCOUNTER — Other Ambulatory Visit (HOSPITAL_COMMUNITY): Payer: Self-pay

## 2023-02-27 ENCOUNTER — Ambulatory Visit (INDEPENDENT_AMBULATORY_CARE_PROVIDER_SITE_OTHER): Payer: 59 | Admitting: Nurse Practitioner

## 2023-02-27 ENCOUNTER — Encounter: Payer: Self-pay | Admitting: Nurse Practitioner

## 2023-02-27 ENCOUNTER — Ambulatory Visit: Payer: 59 | Attending: Nurse Practitioner

## 2023-02-27 VITALS — BP 102/80 | HR 67 | Temp 98.0°F | Ht 60.0 in | Wt 168.2 lb

## 2023-02-27 DIAGNOSIS — Z1159 Encounter for screening for other viral diseases: Secondary | ICD-10-CM

## 2023-02-27 DIAGNOSIS — M25862 Other specified joint disorders, left knee: Secondary | ICD-10-CM | POA: Diagnosis not present

## 2023-02-27 DIAGNOSIS — R198 Other specified symptoms and signs involving the digestive system and abdomen: Secondary | ICD-10-CM | POA: Diagnosis not present

## 2023-02-27 DIAGNOSIS — R002 Palpitations: Secondary | ICD-10-CM

## 2023-02-27 DIAGNOSIS — E875 Hyperkalemia: Secondary | ICD-10-CM

## 2023-02-27 HISTORY — DX: Encounter for screening for other viral diseases: Z11.59

## 2023-02-27 LAB — BASIC METABOLIC PANEL
BUN: 12 mg/dL (ref 6–23)
CO2: 29 meq/L (ref 19–32)
Calcium: 9.4 mg/dL (ref 8.4–10.5)
Chloride: 103 meq/L (ref 96–112)
Creatinine, Ser: 0.81 mg/dL (ref 0.40–1.20)
GFR: 94.68 mL/min (ref >60.00)
Glucose, Bld: 89 mg/dL (ref 70–99)
Potassium: 4.1 meq/L (ref 3.5–5.1)
Sodium: 141 meq/L (ref 135–145)

## 2023-02-27 NOTE — Assessment & Plan Note (Signed)
Intermittent, seem associated with anxiety. Heart rhythm regular on exam today will order long-term monitor to evaluate further.

## 2023-02-27 NOTE — Assessment & Plan Note (Addendum)
Symptoms consistent with mixed IBS, but no medical evaluation completed by GI in the past. Patient reports symptoms are manageable with PRN levsin and does not wish to pursue further that this time. She was encouraged to let me know if something changes. No red flags appreciated on history of exam today.

## 2023-02-27 NOTE — Assessment & Plan Note (Signed)
No appreciable mass identified on exam, but will order ultrasound of soft tissue to evaluate further. Further recommendations may be made based on these results.

## 2023-02-27 NOTE — Progress Notes (Unsigned)
EP to read

## 2023-02-27 NOTE — Assessment & Plan Note (Signed)
Labs ordered, further recommendations may be made based upon his results. 

## 2023-02-27 NOTE — Progress Notes (Signed)
Established Patient Office Visit  Subjective   Patient ID: Sandra Brewer, female    DOB: 11/07/1990  Age: 32 y.o. MRN: [id number]  Chief Complaint  Patient presents with   New Patient (Initial Visit)    Left knee pain( behind the knee) been going for couple of month. Has not taken any form of pain medication for, pain happens more when sitting.    Sandra Brewer arrives to transition care to this office. She has multiple concerns as listed below:  Mass of posterior left knee: Has been present for 2 months or go. Dull ache when sitting down 6/10 in intensity. Has not taken medication to ease the pain. Will elevate leg to treat pain. No significant personal history of smoking, not on hormonal medications, no recent prolonged travel. 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 No personal or family history of blood clots.  2. Palpitations: Cardiac, occur at random times, sometimes associated with chest tightness. Resolves spontaneously within a few moments. Often occur when anxious, no other identifiable triggers. TSH has been normal and stable over the last year.  3. Mixed constipation and Diarrhea: Sometimes associated with abdominal cramping. Will take Levsin as needed with improvement in discomfort. No visible blood, no unintentional weight loss.    Per chart review I identified potassium of 5.3 on last metabolic panel.      Review of Systems  Constitutional:  Negative for chills, fever and weight loss.  Respiratory:  Negative for cough, shortness of breath and wheezing.   Cardiovascular:  Positive for palpitations. Negative for chest pain.  Gastrointestinal:  Positive for constipation and diarrhea. Negative for blood in stool.  Neurological:  Negative for seizures and loss of consciousness.  Psychiatric/Behavioral:  Negative for suicidal ideas. The patient is nervous/anxious and has insomnia.       Objective:     BP 102/80   Pulse 67   Temp 98 F (36.7 C) (Temporal)   Ht 5'  (1.524 m)   Wt 168 lb 4 oz (76.3 kg)   LMP 02/19/2023   SpO2 100%   BMI 32.86 kg/m  BP Readings from Last 3 Encounters:  02/27/23 102/80  12/26/22 122/82  10/14/22 113/79   Wt Readings from Last 3 Encounters:  02/27/23 168 lb 4 oz (76.3 kg)  12/26/22 171 lb 14.4 oz (78 kg)  10/14/22 167 lb 14.4 oz (76.2 kg)      Physical Exam Vitals reviewed.  Constitutional:      General: She is not in acute distress.    Appearance: Normal appearance.  HENT:     Head: Normocephalic and atraumatic.  Neck:     Vascular: No carotid bruit.  Cardiovascular:     Rate and Rhythm: Normal rate and regular rhythm.     Pulses: Normal pulses.     Heart sounds: Normal heart sounds.  Pulmonary:     Effort: Pulmonary effort is normal.     Breath sounds: Normal breath sounds.  Musculoskeletal:     Right lower leg: Normal. No swelling, deformity, tenderness or bony tenderness. No edema.     Left lower leg: Normal. No swelling, deformity, tenderness or bony tenderness. No edema.  Skin:    General: Skin is warm and dry.  Neurological:     General: No focal deficit present.     Mental Status: She is alert and oriented to person, place, and time.  Psychiatric:        Mood and Affect:  Mood normal.        Behavior: Behavior normal.        Judgment: Judgment normal.      No results found for any visits on 02/27/23.    The ASCVD Risk score (Arnett DK, et al., 2019) failed to calculate for the following reasons:   The 2019 ASCVD risk score is only valid for ages 48 to 58    Assessment & Plan:   Problem List Items Addressed This Visit       Other   Hyperkalemia - Primary   Incidental finding. Check BMP, further recommendations may be made based on these results.       Relevant Orders   Basic metabolic panel   Encounter for hepatitis C screening test for low risk patient   Labs ordered, further recommendations may be made based upon his results       Relevant Orders   Hepatitis C  antibody   Palpitations   Intermittent, seem associated with anxiety. Heart rhythm regular on exam today will order long-term monitor to evaluate further.       Relevant Orders   LONG TERM MONITOR (3-14 DAYS)   Mass of joint of left knee   No appreciable mass identified on exam, but will order ultrasound of soft tissue to evaluate further. Further recommendations may be made based on these results.       Relevant Orders   [place] LT LOWER EXTREM LTD SOFT TISSUE NON VASCULAR   Alternating constipation and diarrhea   Symptoms consistent with mixed IBS, but no medical evaluation completed by GI in the past. Patient reports symptoms are manageable with PRN levsin and does not wish to pursue further that this time. She was encouraged to let me know if something changes. No red flags appreciated on history of exam today.        Return in about 8 weeks (around 04/24/2023) for F/U with Maralyn Sago.    Sandra Paddy, NP

## 2023-02-27 NOTE — Assessment & Plan Note (Signed)
Incidental finding. Check BMP, further recommendations may be made based on these results.

## 2023-02-28 LAB — HEPATITIS C ANTIBODY: Hepatitis C Ab: NONREACTIVE

## 2023-03-02 DIAGNOSIS — R002 Palpitations: Secondary | ICD-10-CM | POA: Diagnosis not present

## 2023-03-11 ENCOUNTER — Ambulatory Visit
Admission: RE | Admit: 2023-03-11 | Discharge: 2023-03-11 | Disposition: A | Discharge status: Home / Self Care | Payer: Commercial Managed Care - PPO | Source: Ambulatory Visit | Attending: Nurse Practitioner | Admitting: Nurse Practitioner

## 2023-03-11 DIAGNOSIS — R2242 Localized swelling, mass and lump, left lower limb: Secondary | ICD-10-CM | POA: Diagnosis not present

## 2023-03-11 DIAGNOSIS — M25862 Other specified joint disorders, left knee: Secondary | ICD-10-CM

## 2023-03-14 DIAGNOSIS — R002 Palpitations: Secondary | ICD-10-CM | POA: Diagnosis not present

## 2023-03-16 ENCOUNTER — Other Ambulatory Visit: Payer: Self-pay | Admitting: Cardiology

## 2023-03-16 DIAGNOSIS — R002 Palpitations: Secondary | ICD-10-CM

## 2023-03-19 ENCOUNTER — Ambulatory Visit: Payer: 59 | Admitting: Neurology

## 2023-04-01 ENCOUNTER — Ambulatory Visit (HOSPITAL_BASED_OUTPATIENT_CLINIC_OR_DEPARTMENT_OTHER): Payer: 59 | Admitting: Family Medicine

## 2023-04-16 ENCOUNTER — Ambulatory Visit (HOSPITAL_BASED_OUTPATIENT_CLINIC_OR_DEPARTMENT_OTHER): Payer: 59 | Admitting: Family Medicine

## 2023-04-24 ENCOUNTER — Ambulatory Visit: Payer: 59 | Admitting: Nurse Practitioner

## 2023-05-02 ENCOUNTER — Ambulatory Visit: Payer: Commercial Managed Care - PPO | Admitting: Nurse Practitioner

## 2023-05-16 ENCOUNTER — Other Ambulatory Visit: Payer: Self-pay | Admitting: Family Medicine

## 2023-05-16 ENCOUNTER — Other Ambulatory Visit (HOSPITAL_COMMUNITY): Payer: Self-pay

## 2023-05-19 ENCOUNTER — Other Ambulatory Visit (HOSPITAL_COMMUNITY): Payer: Self-pay

## 2023-05-19 MED FILL — Atogepant Tab 60 MG: ORAL | 90 days supply | Qty: 90 | Fill #0 | Status: AC

## 2023-06-06 ENCOUNTER — Ambulatory Visit: Admitting: Nurse Practitioner

## 2023-06-06 ENCOUNTER — Ambulatory Visit: Payer: 59 | Admitting: Neurology

## 2023-06-06 NOTE — Progress Notes (Deleted)
 [facility]    Provider:  Dr Lucia Gaskins Requesting Provider: de [place], Buren Kos, MD Primary Care Provider:  [name], NP  CC:  ***  HPI:  Sandra Brewer is a 33 y.o. female here as requested by de Peru, Buren Kos, MD for migraines. has Anxiety; Intractable migraine without aura and without status migrainosus; Gastroesophageal reflux disease with esophagitis without hemorrhage; Slow transit constipation; Irritable bowel syndrome with both constipation and diarrhea; Hand pain, left; Obesity, Class I, BMI 30-34.9; Knee pain, right; Hyperkalemia; Encounter for hepatitis C screening test for low risk patient; Palpitations; Mass of joint of left knee; and Alternating constipation and diarrhea on their problem list.  Patient was on [place] but insurance would not approve.    Reviewed notes, labs and imaging from outside physicians, which showed ***  07/03/2020: CLINICAL DATA:  Headaches.   EXAM: MRI HEAD WITHOUT CONTRAST   TECHNIQUE: Multiplanar, multiecho pulse sequences of the brain and surrounding structures were obtained without intravenous contrast.   COMPARISON:  None.   FINDINGS: Brain: No acute infarction, hemorrhage, hydrocephalus, extra-axial collection or mass lesion. Mild frontal predominant T2/FLAIR hyperintensities within the white matter, mildly advanced in number for age.   Vascular: Major arterial flow voids are maintained skull base.   Skull and upper cervical spine: Normal marrow signal.   Sinuses/Orbits: Sinuses are largely clear.  Right concha bullosa.   Other: No mastoid effusions   IMPRESSION: 1. No evidence of acute abnormality. 2. Mild frontal predominant T2/FLAIR hyperintensities within the white matter, mildly advanced in number for age. This finding is nonspecific but may be secondary to chronic migraines (particularly given frontal predominance), chronic microvascular ischemic disease, or less likely prior demyelination or  inflammation.      Latest Ref Rng & Units 02/27/2023   11:21 AM 10/14/2022    8:32 AM 05/14/2021   11:42 AM  BMP  Glucose 70 - 99 mg/dL 89  161  90   BUN 6 - 23 mg/dL 12  13  11    Creatinine 0.40 - 1.20 mg/dL 0.96  0.45  4.09   BUN/Creat Ratio 9 - 23  16    Sodium 135 - 145 mEq/L 141  140  137   Potassium 3.5 - 5.1 mEq/L 4.1  5.3  3.7   Chloride 96 - 112 mEq/L 103  105  103   CO2 19 - 32 mEq/L 29  22  28    Calcium 8.4 - 10.5 mg/dL 9.4  9.7  9.1       Latest Ref Rng & Units 10/14/2022    8:32 AM 01/14/2022    8:56 AM 06/25/2021    9:22 AM  CBC  WBC 3.4 - 10.8 x10E3/uL 6.8  5.5  5.8   Hemoglobin 11.1 - 15.9 g/dL 81.1  91.4  78.2   Hematocrit 34.0 - 46.6 % 37.0  37.6  36.5   Platelets 150 - 450 x10E3/uL 323  322  346.0    10/14/2022: TSH nml 1.58, hgba1c 5.4  Medications tried: Tylenol, Qulipta, Celexa, Voltaren tablets, Benadryl, ibuprofen, Mobic tablets, Medrol Dosepak, Zofran, Imitrex  Review of Systems: Patient complains of symptoms per HPI as well as the following symptoms ***. Pertinent negatives and positives per HPI. All others negative.   Social History   Socioeconomic History   Marital status: Married    Spouse name: Not on file   Number of children: Not on file   Years of education: Not on file   Highest education level: Associate  degree: occupational, technical, or vocational program  Occupational History   Not on file  Tobacco Use   Smoking status: Never   Smokeless tobacco: Never  Vaping Use   Vaping status: Never Used  Substance and Sexual Activity   Alcohol use: No   Drug use: No   Sexual activity: Yes    Birth control/protection: Surgical    Comment: tubal  Other Topics Concern   Not on file  Social History Narrative   Not on file   Social Drivers of Health   Financial Resource Strain: Low Risk  (02/22/2023)   Overall Financial Resource Strain (CARDIA)    Difficulty of Paying Living Expenses: Not hard at all  Food Insecurity: No Food  Insecurity (02/22/2023)   Hunger Vital Sign    Worried About Running Out of Food in the Last Year: Never true    Ran Out of Food in the Last Year: Never true  Transportation Needs: No Transportation Needs (02/22/2023)   PRAPARE - Administrator, Civil Service (Medical): No    Lack of Transportation (Non-Medical): No  Physical Activity: Insufficiently Active (02/22/2023)   Exercise Vital Sign    Days of Exercise per Week: 3 days    Minutes of Exercise per Session: 30 min  Stress: Stress Concern Present (02/22/2023)   [employer] of Occupational Health - Occupational Stress Questionnaire    Feeling of Stress : Rather much  Social Connections: Unknown (02/22/2023)   Social Connection and Isolation Panel [NHANES]    Frequency of Communication with Friends and Family: Once a week    Frequency of Social Gatherings with Friends and Family: Once a week    Attends Religious Services: Patient declined    Database administrator or Organizations: No    Attends Engineer, structural: Not on file    Marital Status: Married  Catering manager Violence: Not on file    Family History  Problem Relation Age of Onset   Hypertension Mother    Hypertension Father    Heart disease Father    Heart attack Father     Past Medical History:  Diagnosis Date   Allergy    Seasonal   Anemia 2011   Pregnancy   Anxiety    Depression    Encounter for hepatitis C screening test for low risk patient 02/27/2023   Gall stones    Migraines     Patient Active Problem List   Diagnosis Date Noted   Hyperkalemia 02/27/2023   Encounter for hepatitis C screening test for low risk patient 02/27/2023   Palpitations 02/27/2023   Mass of joint of left knee 02/27/2023   Alternating constipation and diarrhea 02/27/2023   Knee pain, right 12/26/2022   Hand pain, left 07/15/2022   Obesity, Class I, BMI 30-34.9 07/15/2022   Irritable bowel syndrome with both constipation and diarrhea  06/25/2021   Slow transit constipation 06/22/2021   Gastroesophageal reflux disease with esophagitis without hemorrhage 04/02/2021   Intractable migraine without aura and without status migrainosus 06/26/2020   Anxiety 01/14/2019    Past Surgical History:  Procedure Laterality Date   CHOLECYSTECTOMY N/A 11/20/2015   Procedure: LAPAROSCOPIC CHOLECYSTECTOMY;  Surgeon: Franky Macho, MD;  Location: [facility];  Service: General;  Laterality: N/A;   LAPAROSCOPIC BILATERAL SALPINGECTOMY Bilateral 10/10/2015   Procedure: LAPAROSCOPIC BILATERAL SALPINGECTOMY;  Surgeon: Tilda Burrow, MD;  Location: [facility];  Service: Gynecology;  Laterality: Bilateral;   NO PAST SURGERIES     TUBAL LIGATION  Current Outpatient Medications  Medication Sig Dispense Refill   Atogepant (QULIPTA) 60 MG TABS Take 1 tablet (60 mg total) by mouth daily. 90 tablet 1   Atogepant (QULIPTA) 60 MG TABS Take 1 tablet (60 mg total) by mouth. 90 tablet 1   Atogepant (QULIPTA) 60 MG TABS Take 1 tablet (60 mg total) by mouth. 90 tablet 1   busPIRone (BUSPAR) 5 MG tablet Take 1 tablet (5 mg total) by mouth 2 (two) times daily. 60 tablet 1   citalopram (CELEXA) 20 MG tablet TAKE 1 AND 1/2 TABLETS BY MOUTH DAILY 45 tablet 8   hyoscyamine (LEVSIN) 0.125 MG tablet Take 1 tablet (0.125 mg total) by mouth every 6 (six) hours as needed. 90 tablet 3   No current facility-administered medications for this visit.    Allergies as of 06/06/2023   (No Known Allergies)    Vitals: There were no vitals taken for this visit. Last Weight:  Wt Readings from Last 1 Encounters:  02/27/23 168 lb 4 oz (76.3 kg)   Last Height:   Ht Readings from Last 1 Encounters:  02/27/23 5' (1.524 m)     Physical exam: Exam: Gen: NAD, conversant, well nourised, obese, well groomed                     CV: RRR, no MRG. No Carotid Bruits. No peripheral edema, warm, nontender Eyes: Conjunctivae clear without exudates or hemorrhage  Neuro: Detailed  Neurologic Exam  Speech:    Speech is normal; fluent and spontaneous with normal comprehension.  Cognition:    The patient is oriented to person, place, and time;     recent and remote memory intact;     language fluent;     normal attention, concentration,     fund of knowledge Cranial Nerves:    The pupils are equal, round, and reactive to light. The fundi are normal and spontaneous venous pulsations are present. Visual fields are full to finger confrontation. Extraocular movements are intact. Trigeminal sensation is intact and the muscles of mastication are normal. The face is symmetric. The palate elevates in the midline. Hearing intact. Voice is normal. Shoulder shrug is normal. The tongue has normal motion without fasciculations.   Coordination:    Normal finger to nose and heel to shin. Normal rapid alternating movements.   Gait:    Heel-toe and tandem gait are normal.   Motor Observation:    No asymmetry, no atrophy, and no involuntary movements noted. Tone:    Normal muscle tone.    Posture:    Posture is normal. normal erect    Strength:    Strength is V/V in the upper and lower limbs.      Sensation: intact to LT     Reflex Exam:  DTR's:    Deep tendon reflexes in the upper and lower extremities are normal bilaterally.   Toes:    The toes are downgoing bilaterally.   Clonus:    Clonus is absent.    Assessment/Plan:    No orders of the defined types were placed in this encounter.  No orders of the defined types were placed in this encounter.   Cc: de [place], Buren Kos, MD,  [name], NP  Naomie Dean, MD  [facility] [address] [place], [place] [address]  Phone [phone] Fax [phone]

## 2023-06-21 ENCOUNTER — Other Ambulatory Visit (HOSPITAL_COMMUNITY): Payer: Self-pay

## 2023-07-11 ENCOUNTER — Ambulatory Visit: Admitting: Nurse Practitioner

## 2023-08-07 ENCOUNTER — Other Ambulatory Visit: Payer: Self-pay | Admitting: Podiatry

## 2023-08-07 MED ORDER — MELOXICAM 15 MG PO TABS: 15.0000 mg | ORAL_TABLET | Freq: Every day | ORAL | 0 refills | Status: DC

## 2023-08-08 ENCOUNTER — Ambulatory Visit: Admitting: Podiatry

## 2023-08-08 ENCOUNTER — Ambulatory Visit: Admitting: Nurse Practitioner

## 2023-08-08 DIAGNOSIS — S92355A Nondisplaced fracture of fifth metatarsal bone, left foot, initial encounter for closed fracture: Secondary | ICD-10-CM | POA: Diagnosis not present

## 2023-08-08 MED ORDER — OXYCODONE-ACETAMINOPHEN 5-325 MG PO TABS
1.0000 | ORAL_TABLET | ORAL | 0 refills | Status: DC | PRN
Start: 2023-08-08 — End: 2023-10-23

## 2023-08-08 NOTE — Progress Notes (Signed)
  Subjective:  Patient ID: Sandra Brewer, female    DOB: Dec 01, 1990,  MRN: [id number]  No chief complaint on file.   33 y.o. female presents with the above complaint.  Patient presents with left fifth metatarsal fracture.  She states she sustained it while running and got caught in a blanket.  Denies any other acute complaints.  Wanted to get it evaluated there are some ecchymosis bruising present pain scale 7 out of 10 dull aching nature.   Review of Systems: Negative except as noted in the HPI. Denies N/V/F/Ch.  Past Medical History:  Diagnosis Date   Allergy    Seasonal   Anemia 2011   Pregnancy   Anxiety    Depression    Encounter for hepatitis C screening test for low risk patient 02/27/2023   Gall stones    Migraines     Current Outpatient Medications:    meloxicam  (MOBIC ) 15 MG tablet, Take 1 tablet (15 mg total) by mouth daily., Disp: 60 tablet, Rfl: 0   oxyCODONE -acetaminophen  (PERCOCET) 5-325 MG tablet, Take 1 tablet by mouth every 4 (four) hours as needed for severe pain (pain score 7-10)., Disp: 30 tablet, Rfl: 0   Atogepant  (QULIPTA ) 60 MG TABS, Take 1 tablet (60 mg total) by mouth daily., Disp: 90 tablet, Rfl: 1   Atogepant  (QULIPTA ) 60 MG TABS, Take 1 tablet (60 mg total) by mouth., Disp: 90 tablet, Rfl: 1   Atogepant  (QULIPTA ) 60 MG TABS, Take 1 tablet (60 mg total) by mouth., Disp: 90 tablet, Rfl: 1   busPIRone  (BUSPAR ) 5 MG tablet, Take 1 tablet (5 mg total) by mouth 2 (two) times daily., Disp: 60 tablet, Rfl: 1   citalopram  (CELEXA ) 20 MG tablet, TAKE 1 AND 1/2 TABLETS BY MOUTH DAILY, Disp: 45 tablet, Rfl: 8   hyoscyamine  (LEVSIN) 0.125 MG tablet, Take 1 tablet (0.125 mg total) by mouth every 6 (six) hours as needed., Disp: 90 tablet, Rfl: 3  Social History   Tobacco Use  Smoking Status Never  Smokeless Tobacco Never    No Known Allergies Objective:  There were no vitals filed for this visit. There is no height or weight on file to calculate  BMI. Constitutional Well developed. Well nourished.  Vascular Dorsalis pedis pulses palpable bilaterally. Posterior tibial pulses palpable bilaterally. Capillary refill normal to all digits.  No cyanosis or clubbing noted. Pedal hair growth normal.  Neurologic Normal speech. Oriented to person, place, and time. Epicritic sensation to light touch grossly present bilaterally.  Dermatologic Nails well groomed and normal in appearance. No open wounds. No skin lesions.  Orthopedic: Pain on palpation left fifth metatarsal ecchymosis bruising noted swelling noted.  No open wounds or lesion noted.   Radiographs: 3 views of skeletally mature adult left foot: Left fifth metatarsal nondisplaced fracture noted.  Appears to be in good alignment no decrease in metatarsal parabola.  Well aligned. Assessment:   1. Nondisplaced fracture of fifth metatarsal bone, left foot, initial encounter for closed fracture    Plan:  Patient was evaluated and treated and all questions answered.  Left fifth metatarsal fracture closed - All questions and concerns were discussed with the patient in extensive detail - Given the amount of pain that she is having she will benefit from cam boot immobilization she will wear the boot for next 4 weeks.  Due to extensive pain she would benefit from Percocet for pain control.  She will take it as needed for pain control  No follow-ups on file.

## 2023-08-16 ENCOUNTER — Other Ambulatory Visit (HOSPITAL_COMMUNITY): Payer: Self-pay

## 2023-08-16 MED FILL — Atogepant Tab 60 MG: ORAL | 90 days supply | Qty: 90 | Fill #1 | Status: AC

## 2023-08-18 ENCOUNTER — Other Ambulatory Visit (HOSPITAL_COMMUNITY): Payer: Self-pay

## 2023-08-18 ENCOUNTER — Telehealth: Payer: Self-pay

## 2023-08-18 NOTE — Telephone Encounter (Signed)
 Pharmacy Patient Advocate Encounter   Received notification from CoverMyMeds that prior authorization for Qulipta  60 is required/requested.   Insurance verification completed.   The patient is insured through [facility] .   Per test claim: PA required; PA submitted to above mentioned insurance via CoverMyMeds Key/confirmation #/EOC [id number] Status is pending

## 2023-08-20 ENCOUNTER — Other Ambulatory Visit (HOSPITAL_COMMUNITY): Payer: Self-pay

## 2023-08-22 ENCOUNTER — Other Ambulatory Visit (HOSPITAL_COMMUNITY): Payer: Self-pay

## 2023-08-22 MED FILL — Atogepant Tab 60 MG: ORAL | 90 days supply | Qty: 90 | Fill #1 | Status: CN

## 2023-08-22 NOTE — Telephone Encounter (Signed)
 Pharmacy Patient Advocate Encounter  Received notification from [employer] that Prior Authorization for Qulipta  60 has been DENIED.  Full denial letter will be uploaded to the media tab. See denial reason below.   PA #/Case ID/Reference #: Sandra Brewer

## 2023-08-26 ENCOUNTER — Other Ambulatory Visit (HOSPITAL_COMMUNITY): Payer: Self-pay

## 2023-08-26 ENCOUNTER — Encounter: Payer: Self-pay | Admitting: Nurse Practitioner

## 2023-08-26 MED FILL — Atogepant Tab 60 MG: ORAL | 90 days supply | Qty: 90 | Fill #1 | Status: CN

## 2023-08-27 ENCOUNTER — Other Ambulatory Visit (HOSPITAL_COMMUNITY): Payer: Self-pay

## 2023-09-08 ENCOUNTER — Other Ambulatory Visit (HOSPITAL_COMMUNITY): Payer: Self-pay

## 2023-09-19 ENCOUNTER — Other Ambulatory Visit (HOSPITAL_COMMUNITY): Payer: Self-pay

## 2023-09-20 ENCOUNTER — Other Ambulatory Visit (HOSPITAL_COMMUNITY): Payer: Self-pay

## 2023-09-20 ENCOUNTER — Telehealth (HOSPITAL_COMMUNITY): Payer: Self-pay | Admitting: Pharmacy Technician

## 2023-09-22 ENCOUNTER — Other Ambulatory Visit: Payer: Self-pay

## 2023-09-22 ENCOUNTER — Other Ambulatory Visit (HOSPITAL_COMMUNITY): Payer: Self-pay

## 2023-09-22 MED FILL — Atogepant Tab 60 MG: ORAL | 90 days supply | Qty: 90 | Fill #1 | Status: CN

## 2023-09-26 ENCOUNTER — Ambulatory Visit: Admitting: Nurse Practitioner

## 2023-10-23 ENCOUNTER — Ambulatory Visit: Admitting: Adult Health

## 2023-10-23 ENCOUNTER — Encounter: Payer: Self-pay | Admitting: Adult Health

## 2023-10-23 VITALS — BP 134/92 | HR 67 | Ht 60.0 in | Wt 159.0 lb

## 2023-10-23 DIAGNOSIS — N946 Dysmenorrhea, unspecified: Secondary | ICD-10-CM | POA: Diagnosis not present

## 2023-10-23 DIAGNOSIS — R030 Elevated blood-pressure reading, without diagnosis of hypertension: Secondary | ICD-10-CM

## 2023-10-23 DIAGNOSIS — R10814 Left lower quadrant abdominal tenderness: Secondary | ICD-10-CM

## 2023-10-23 NOTE — Progress Notes (Signed)
  Subjective:     Patient ID: Sandra Brewer, female   DOB: 1990/05/26, 33 y.o.   MRN: [id number]  HPI Sandra Brewer is a 33 year old white female, married, G2P2002 in complaining of cramping with her period, esp left side, has noticed for last 2-3 months.      Component Value Date/Time   DIAGPAP  02/12/2021 0920    - Negative for intraepithelial lesion or malignancy (NILM)   DIAGPAP  01/14/2019 1140    - Negative for intraepithelial lesion or malignancy (NILM)   HPVHIGH Negative 01/14/2019 1140   ADEQPAP  02/12/2021 0920    Satisfactory for evaluation; transformation zone component PRESENT.   ADEQPAP  01/14/2019 1140    Satisfactory for evaluation; transformation zone component PRESENT.   PCP is Lauraine Pereyra NP  Review of Systems  +cramping with her period, esp left side, has noticed for last 2-3 months.  Denies MI,stroke,DVT,or breast cancer Has migraines with aura    Reviewed past medical,surgical, social and family history. Reviewed medications and allergies.  Objective:   Physical Exam BP (!) 134/92 (BP Location: Left Arm, Patient Position: Sitting, Cuff Size: Normal)   Pulse 67   Ht 5' (1.524 m)   Wt 159 lb (72.1 kg)   LMP 10/13/2023 (Exact Date)   BMI 31.05 kg/m     Skin warm and dry.Pelvic: external genitalia is normal in appearance no lesions, vagina: pink,urethra has no lesions or masses noted, cervix:smooth and bulbous, uterus: normal size, shape and contour, non tender, no masses felt, adnexa: no masses + tenderness noted, LLQ.  Bladder is non tender and no masses felt. Fall risk is moderate  Upstream - 10/23/23 0958       Pregnancy Intention Screening   Does the patient want to become pregnant in the next year? No    Does the patient's partner want to become pregnant in the next year? No    Would the patient like to discuss contraceptive options today? No      Contraception Wrap Up   Current Method Female Sterilization    End Method Female Sterilization    Contraception  Counseling Provided No         Examination chaperoned by Clarita Salt LPN  Assessment:     1. Menstrual cramps (Primary) +cramping with her period, esp left side, has noticed for last 2-3 months.  Scheduled pelvic US  at [facility] for Saturday 11/08/23 at 9 am to assess uterus and ovaries  - US  PELVIC COMPLETE WITH TRANSVAGINAL; Future  2. Left lower quadrant abdominal tenderness without rebound tenderness +LLQ tenderness  Scheduled pelvic US  at [facility] for Saturday 11/08/23 at 9 am to assess uterus and ovaries \ Discussed could have a cyst left ovary, but will wait on US  results, could try POP to suppress ovaries  Take 3 advil  and 2 ES tylenol  for pain if needed and push fluids  - US  PELVIC COMPLETE WITH TRANSVAGINAL; Future     3. Elevated BP without diagnosis of hypertension Keep check on BP Watch salt and sugar  Plan:     Follow up  TBD

## 2023-10-27 ENCOUNTER — Other Ambulatory Visit: Payer: Self-pay | Admitting: Podiatry

## 2023-11-08 ENCOUNTER — Ambulatory Visit (HOSPITAL_BASED_OUTPATIENT_CLINIC_OR_DEPARTMENT_OTHER)
Admission: RE | Admit: 2023-11-08 | Discharge: 2023-11-08 | Disposition: A | Discharge status: Home / Self Care | Source: Ambulatory Visit | Attending: Adult Health | Admitting: Adult Health

## 2023-11-08 DIAGNOSIS — N946 Dysmenorrhea, unspecified: Secondary | ICD-10-CM | POA: Diagnosis not present

## 2023-11-08 DIAGNOSIS — N888 Other specified noninflammatory disorders of cervix uteri: Secondary | ICD-10-CM | POA: Diagnosis not present

## 2023-11-08 DIAGNOSIS — R10814 Left lower quadrant abdominal tenderness: Secondary | ICD-10-CM | POA: Diagnosis not present

## 2023-11-11 ENCOUNTER — Ambulatory Visit: Payer: Self-pay | Admitting: Adult Health

## 2023-11-14 ENCOUNTER — Ambulatory Visit: Admitting: Nurse Practitioner

## 2023-12-13 ENCOUNTER — Other Ambulatory Visit: Payer: Self-pay | Admitting: Medical Genetics

## 2023-12-13 DIAGNOSIS — Z006 Encounter for examination for normal comparison and control in clinical research program: Secondary | ICD-10-CM

## 2024-02-16 ENCOUNTER — Other Ambulatory Visit: Payer: Self-pay | Admitting: Medical Genetics

## 2024-02-16 DIAGNOSIS — Z006 Encounter for examination for normal comparison and control in clinical research program: Secondary | ICD-10-CM

## 2024-02-16 NOTE — Addendum Note (Signed)
 Addended by: PENNSTROM, JORDYN on: 02/16/2024 03:52 PM   Modules accepted: Orders

## 2024-05-07 ENCOUNTER — Encounter: Admitting: Nurse Practitioner
